# Patient Record
Sex: Male | Born: 1970 | Race: White | Hispanic: No | Marital: Married | Smoking: Never smoker
Health system: Southern US, Community
[De-identification: ages and names within clinical notes are randomized; demographics above are authoritative.]

## PROBLEM LIST (undated history)

## (undated) DIAGNOSIS — F419 Anxiety disorder, unspecified: Secondary | ICD-10-CM

## (undated) DIAGNOSIS — T7840XA Allergy, unspecified, initial encounter: Secondary | ICD-10-CM

## (undated) DIAGNOSIS — K219 Gastro-esophageal reflux disease without esophagitis: Secondary | ICD-10-CM

## (undated) HISTORY — DX: Anxiety disorder, unspecified: F41.9

## (undated) HISTORY — DX: Gastro-esophageal reflux disease without esophagitis: K21.9

## (undated) HISTORY — DX: Allergy, unspecified, initial encounter: T78.40XA

## (undated) HISTORY — PX: APPENDECTOMY: SHX54

---

## 2004-07-23 ENCOUNTER — Emergency Department: Payer: Self-pay | Admitting: Emergency Medicine

## 2007-03-23 ENCOUNTER — Emergency Department: Payer: Self-pay | Admitting: Unknown Physician Specialty

## 2013-06-07 ENCOUNTER — Observation Stay: Payer: Self-pay | Admitting: Surgery

## 2013-06-07 LAB — COMPREHENSIVE METABOLIC PANEL
Albumin: 4 g/dL (ref 3.4–5.0)
Alkaline Phosphatase: 65 U/L
Anion Gap: 3 — ABNORMAL LOW (ref 7–16)
BUN: 13 mg/dL (ref 7–18)
Bilirubin,Total: 0.6 mg/dL (ref 0.2–1.0)
Calcium, Total: 9.4 mg/dL (ref 8.5–10.1)
Chloride: 107 mmol/L (ref 98–107)
Co2: 26 mmol/L (ref 21–32)
Creatinine: 0.97 mg/dL (ref 0.60–1.30)
EGFR (African American): >60
EGFR (Non-African Amer.): >60
Glucose: 98 mg/dL (ref 65–99)
Osmolality: 272 (ref 275–301)
Potassium: 4 mmol/L (ref 3.5–5.1)
SGOT(AST): 41 U/L — ABNORMAL HIGH (ref 15–37)
SGPT (ALT): 84 U/L — ABNORMAL HIGH (ref 12–78)
Sodium: 136 mmol/L (ref 136–145)
Total Protein: 8.1 g/dL (ref 6.4–8.2)

## 2013-06-07 LAB — CBC WITH DIFFERENTIAL/PLATELET
Basophil #: 0.1 10*3/uL (ref 0.0–0.1)
Basophil %: 0.5 %
Eosinophil #: 0.2 10*3/uL (ref 0.0–0.7)
Eosinophil %: 1.1 %
HCT: 49.8 % (ref 40.0–52.0)
HGB: 16.9 g/dL (ref 13.0–18.0)
Lymphocyte #: 4 10*3/uL — ABNORMAL HIGH (ref 1.0–3.6)
Lymphocyte %: 26.5 %
MCH: 30.9 pg (ref 26.0–34.0)
MCHC: 33.9 g/dL (ref 32.0–36.0)
MCV: 91 fL (ref 80–100)
Monocyte #: 1.2 x10 3/mm — ABNORMAL HIGH (ref 0.2–1.0)
Monocyte %: 7.8 %
Neutrophil #: 9.8 10*3/uL — ABNORMAL HIGH (ref 1.4–6.5)
Neutrophil %: 64.1 %
Platelet: 213 10*3/uL (ref 150–440)
RBC: 5.46 10*6/uL (ref 4.40–5.90)
RDW: 12.4 % (ref 11.5–14.5)
WBC: 15.3 10*3/uL — ABNORMAL HIGH (ref 3.8–10.6)

## 2013-06-07 LAB — URINALYSIS, COMPLETE
Bacteria: NONE SEEN
Bilirubin,UR: NEGATIVE
Blood: NEGATIVE
Glucose,UR: NEGATIVE mg/dL (ref 0–75)
Ketone: NEGATIVE
Leukocyte Esterase: NEGATIVE
Nitrite: NEGATIVE
Ph: 5 (ref 4.5–8.0)
Protein: NEGATIVE
RBC,UR: <1 /HPF (ref 0–5)
Specific Gravity: 1.014 (ref 1.003–1.030)
Squamous Epithelial: <1
WBC UR: <1 /HPF (ref 0–5)

## 2013-06-07 LAB — LIPASE, BLOOD: Lipase: 142 U/L (ref 73–393)

## 2013-06-09 LAB — PATHOLOGY REPORT

## 2014-10-29 NOTE — Op Note (Signed)
PATIENT NAME:  Francisco Oneal, Francisco Oneal MR#:  [id number] DATE OF BIRTH:  [birth year]  DATE OF PROCEDURE:  06/07/2013  PREOPERATIVE DIAGNOSIS: Acute appendicitis.   POSTOPERATIVE DIAGNOSIS: Acute/chronic appendicitis.   PROCEDURE PERFORMED:  1.  Laparoscopic appendectomy.  2.  Evaluation of the distal small bowel, approximately 2 feet.   ESTIMATED BLOOD LOSS: 15 mL.   COMPLICATIONS: None.   SPECIMENS: Appendix.   INDICATION FOR SURGERY: The patient is a pleasant, 44 year old who presented with right lower quadrant pain, enlarged and leukocytosis. His clinical story appeared to be consistent with appendicitis. He was thus brought to the Operating Room suite for laparoscopic appendectomy.   DETAILS OF PROCEDURE: Informed consent was obtained. The patient was brought to the Operating Room Suite. He was laid supine on the Operating Room table. He was induced. Endotracheal tube was placed, general anesthesia was administered. His abdomen was then prepped and draped in standard surgical fashion. A timeout was then performed, correctly identifying the patient name, operative site and procedure to be performed. A supraumbilical incision was made. This was deepened down to the fascia. The fascia was incised. The peritoneum was entered. Two stay sutures were placed through the fasciotomy. The Hasson trocar was placed in the abdomen. The abdomen was insufflated. A 5 mm left lower quadrant and suprapubic trocar were placed. The appendix was then visualized. It was adherent to the abdominal sidewall. It was mobilized bluntly from peritoneal attachments. It appeared to be mildly inflamed. I then used a laparoscopic [place] dissector to place a hole in the mesoappendix at the base of the cecum. An endoscopic stapler was then placed across the base of the appendix and ligated. A second load was then placed across the mesoappendix. The appendix was then taken out with an Endo Catch bag through the supraumbilical trocar site.  The appendiceal and mesoappendiceal staple lines were examined and noted to be hemostatic. The abdomen was then irrigated with normal saline. The terminal ileum and distal approximately 2 feet of bowel were then examined.  It was noted to be adherent to the abdominal sidewall in the terminal ileum and then free elsewhere. No obvious Meckel's diverticulum was encountered, inflamed or not. The staple lines were then examined again and noted to be hemostatic. All trocars were then removed, and pneumoperitoneum was evacuated. The supraumbilical trocars were then closed, using the previously placed stay sutures of 0 Vicryl figure-of-eight. The skin was then closed using interrupted 4-0 Monocryl sutures. Steri-Strips, Telfa gauze and Tegaderm were then used to complete the dressing. The patient was then awoken, extubated and brought to the postanesthesia care unit. There were no immediate complications. Needle, sponge, and instrument counts were correct at the end of the procedure.    ____________________________ Glena Norfolk. Ashely Goosby, MD cal:cg D: 06/07/2013 08:67:61 ET T: 06/07/2013 23:51:32 ET JOB#: [id number]  cc: Harrell Gave A. Alaylah Heatherington, MD, <Dictator> Floyde Parkins MD ELECTRONICALLY SIGNED 06/18/2013 11:37

## 2014-10-29 NOTE — H&P (Signed)
History of Present Illness 49 yom who has had RLQ abdominal pain off an on for a year. Last night (~ [place]) he experienced difuse lower abdominal pain, which woke him up twice and migrated to his RLQ. Initially it was quite severe (moreso than ever before), but now it has subsided significantly. No nausea initially, but anorexic now. No vomiting, and no fever. In the past, he has not been tender when examined by his PCP.   Past Med/Surgical Hx:  allergies:   ALLERGIES:  Bee Stings: Hives  Penicillin: Unknown  Family and Social History:  Family History Non-Contributory   Social History negative tobacco, negative ETOH, married, 1 child, works in [employer] (of [employer], etc.)   Place of Living Home   Review of Systems:  Fever/Chills No   Cough No   Sputum No   Abdominal Pain Yes   Diarrhea No   Constipation No   Nausea/Vomiting Yes   SOB/DOE No   Chest Pain No   Dysuria No   Tolerating PT Yes   Tolerating Diet Yes  last meal was last night   Medications/Allergies Reviewed Medications/Allergies reviewed   Physical Exam:  GEN well developed, well nourished, obese   HEENT pink conjunctivae, PERRL, hearing intact to voice, moist oral mucosa   NECK supple  No masses  thyroid not tender  trachea midline   RESP normal resp effort  clear BS  no use of accessory muscles   CARD regular rate  no murmur  no JVD  no Rub   ABD positive tenderness  obese, soft everywhere except RLQ, which displays moderate tenderness, but no rebound or guarding   LYMPH negative neck   EXTR negative cyanosis/clubbing, negative edema   SKIN normal to palpation, No rashes, No ulcers, skin turgor good   NEURO cranial nerves intact, negative tremor, follows commands, motor/sensory function intact   PSYCH alert, A+O to time, place, person, good insight   Lab Results: Hepatic:  30-Nov-14 11:17   Bilirubin, Total 0.6  Alkaline Phosphatase 65 (45-117 NOTE: New Reference  Range 05/29/13)  SGPT (ALT)  84  SGOT (AST)  41  Total Protein, Serum 8.1  Albumin, Serum 4.0  Routine Chem:  30-Nov-14 11:17   Glucose, Serum 98  BUN 13  Creatinine (comp) 0.97  Sodium, Serum 136  Potassium, Serum 4.0  Chloride, Serum 107  CO2, Serum 26  Calcium (Total), Serum 9.4  Osmolality (calc) 272  eGFR (African American) >60  eGFR (Non-African American) >60 (eGFR values <34m/min/1.73 m2 may be an indication of chronic kidney disease (CKD). Calculated eGFR is useful in patients with stable renal function. The eGFR calculation will not be reliable in acutely ill patients when serum creatinine is changing rapidly. It is not useful in  patients on dialysis. The eGFR calculation may not be applicable to patients at the low and high extremes of body sizes, pregnant women, and vegetarians.)  Anion Gap  3  Lipase 142 (Result(s) reported on 07 Jun 2013 at 11:58AM.)  Routine UA:  30-Nov-14 11:17   Color (UA) Yellow  Clarity (UA) Clear  Glucose (UA) Negative  Bilirubin (UA) Negative  Ketones (UA) Negative  Specific Gravity (UA) 1.014  Blood (UA) Negative  pH (UA) 5.0  Protein (UA) Negative  Nitrite (UA) Negative  Leukocyte Esterase (UA) Negative (Result(s) reported on 07 Jun 2013 at 11:46AM.)  RBC (UA) <1 /HPF  WBC (UA) <1 /HPF  Bacteria (UA) NONE SEEN  Epithelial Cells (UA) <1 /HPF  Mucous (  UA) PRESENT (Result(s) reported on 07 Jun 2013 at 11:46AM.)  Routine Hem:  30-Nov-14 11:17   WBC (CBC)  15.3  RBC (CBC) 5.46  Hemoglobin (CBC) 16.9  Hematocrit (CBC) 49.8  Platelet Count (CBC) 213  MCV 91  MCH 30.9  MCHC 33.9  RDW 12.4  Neutrophil % 64.1  Lymphocyte % 26.5  Monocyte % 7.8  Eosinophil % 1.1  Basophil % 0.5  Neutrophil #  9.8  Lymphocyte #  4.0  Monocyte #  1.2  Eosinophil # 0.2  Basophil # 0.1 (Result(s) reported on 07 Jun 2013 at 11:38AM.)   Radiology Results: LabUnknown:    30-Nov-14 12:57, CT Abdomen and Pelvis With Contrast  PACS Image  CT:   CT Abdomen and Pelvis With Contrast  REASON FOR EXAM:    (1) rlq pain; (2) same  COMMENTS:   LMP: (Male)    PROCEDURE: CT  - CT ABDOMEN / PELVIS  W  - Jun 07 2013 12:57PM     CLINICAL DATA:  Right-sided abdominal pain for 1 year, progressive  within the past week    EXAM:  CT ABDOMEN AND PELVIS WITH CONTRAST    TECHNIQUE:  Multidetector CT imaging of the abdomen and pelvis was performed  using the standard protocol following bolus administration of  intravenous contrast.  CONTRAST:  125 cc Isovue 370    COMPARISON:  None.    FINDINGS:  The base of the appendix appears enlarged measuring approximately 1  cm in greatest oblique axial and coronal dimensions (axial image 58,  series 2; coronal image 73, series 6). This finding is associated  with a very minimal amount of adjacent periappendiceal stranding  (axial image 59, series 2). Additionally, there are several  scattered shotty mesenteric lymph nodes within the right lower  abdominal quadrant with index right lower quadrant mesenteric node  measuring approximately 0.8 cm ingreatest short axis diameter  (image 51, series 2). No evidence of perforation.  Ingested enteric contrast extends to the level of the distal small  bowel. Scattered minimal colonic diverticulosis without evidence of  diverticulitis. The bowel is otherwise normal in course and caliber  without discrete area of wall thickening or hyperenhancement. No  pneumoperitoneum, pneumatosis or portal venous gas.    Normal hepatic contour. There is mild diffuse decreased attenuation  of the hepatic parenchyma on this postcontrast examination  suggestive hepatic steatosis. No discrete hepatic lesions. The  gallbladder is decompressed but otherwise normal. No definite intra  or extrahepatic position dilatation. No ascites.    There is symmetric enhancementand excretion of the bilateral  kidneys. No definite renal stones on this postcontrast examination.  No  discrete renal lesions. No urinary obstruction or perinephric  stranding. Normal appearance of the bilateral adrenal glands,  pancreas and spleen.Incidental note is made of a small splenule.    Normal appearance of the pelvic organs. No free fluid within the  pelvis.    Limited visualization the lower thorax is negative for focal  airspace opacity or pleural effusion.    Normal heart size.  Nopericardial effusion.    No acute or aggressive osseus abnormalities. Incidental note is a  made of a limbus body involving the anterior aspect of the superior  endplate of the X41 vertebral body.   IMPRESSION:  1. Findings worrisome for early, acuteappendicitis with wall  thickening involving the base of the appendix and associated very  minimal amount of adjacent periappendiceal stranding and reactive  right lower quadrant mesenteric lymph nodes. No evidence  of  perforation or drainable fluid collection.  2. Colonic diverticulosis without evidence of diverticulitis.      Electronically Signed    By: Sandi Mariscal M.D.    On: 06/07/2013 13:09         Verified By: Aileen Fass, M.D.,    Assessment/Admission Diagnosis Early acute appendicitis, likely recurrent   Plan Lap appy   Electronic Signatures: Consuela Mimes (MD)  (Signed [phone] 15:36)  Authored: CHIEF COMPLAINT and HISTORY, PAST MEDICAL/SURGIAL HISTORY, ALLERGIES, FAMILY AND SOCIAL HISTORY, REVIEW OF SYSTEMS, PHYSICAL EXAM, LABS, Radiology, ASSESSMENT AND PLAN   Last Updated: 30-Nov-14 15:36 by Consuela Mimes (MD)

## 2014-12-08 ENCOUNTER — Inpatient Hospital Stay
Admission: RE | Admit: 2014-12-08 | Discharge: 2014-12-09 | DRG: 502 | Disposition: A | Discharge status: Home / Self Care | Payer: No Typology Code available for payment source | Source: Ambulatory Visit | Attending: Surgery | Admitting: Surgery

## 2014-12-08 ENCOUNTER — Ambulatory Visit: Payer: No Typology Code available for payment source | Admitting: Anesthesiology

## 2014-12-08 ENCOUNTER — Encounter
Admission: RE | Disposition: A | Discharge status: Home / Self Care | Payer: Self-pay | Source: Ambulatory Visit | Attending: Surgery

## 2014-12-08 ENCOUNTER — Encounter: Payer: Self-pay | Admitting: *Deleted

## 2014-12-08 DIAGNOSIS — S52572A Other intraarticular fracture of lower end of left radius, initial encounter for closed fracture: Secondary | ICD-10-CM | POA: Diagnosis present

## 2014-12-08 DIAGNOSIS — S51012A Laceration without foreign body of left elbow, initial encounter: Secondary | ICD-10-CM | POA: Diagnosis present

## 2014-12-08 DIAGNOSIS — S51019A Laceration without foreign body of unspecified elbow, initial encounter: Secondary | ICD-10-CM | POA: Diagnosis present

## 2014-12-08 HISTORY — PX: INCISION AND DRAINAGE OF WOUND: SHX1803

## 2014-12-08 HISTORY — PX: OPEN REDUCTION INTERNAL FIXATION (ORIF) DISTAL RADIAL FRACTURE: SHX5989

## 2014-12-08 SURGERY — OPEN REDUCTION INTERNAL FIXATION (ORIF) DISTAL RADIUS FRACTURE
Anesthesia: General | Laterality: Left | Wound class: Clean

## 2014-12-08 MED ORDER — FENTANYL CITRATE (PF) 100 MCG/2ML IJ SOLN
25.0000 ug | INTRAMUSCULAR | Status: DC | PRN
  Administered 2014-12-08: 25 ug via INTRAVENOUS

## 2014-12-08 MED ORDER — DOCUSATE SODIUM 100 MG PO CAPS
100.0000 mg | ORAL_CAPSULE | Freq: Two times a day (BID) | ORAL | Status: DC
  Administered 2014-12-08 – 2014-12-09 (×2): 100 mg via ORAL
  Filled 2014-12-08 (×2): qty 1

## 2014-12-08 MED ORDER — GLYCOPYRROLATE 0.2 MG/ML IJ SOLN
INTRAMUSCULAR | Status: DC | PRN
  Administered 2014-12-08: 0.2 mg via INTRAVENOUS

## 2014-12-08 MED ORDER — DEXTROSE 5 % IV SOLN
3.0000 g | Freq: Once | INTRAVENOUS | Status: AC
  Administered 2014-12-08: 3 g via INTRAVENOUS
  Filled 2014-12-08: qty 3000

## 2014-12-08 MED ORDER — FENTANYL CITRATE (PF) 100 MCG/2ML IJ SOLN
INTRAMUSCULAR | Status: DC | PRN
  Administered 2014-12-08 (×5): 50 ug via INTRAVENOUS

## 2014-12-08 MED ORDER — ONDANSETRON HCL 4 MG/2ML IJ SOLN: 4.0000 mg | Freq: Four times a day (QID) | INTRAMUSCULAR | Status: DC | PRN

## 2014-12-08 MED ORDER — BUPIVACAINE HCL (PF) 0.5 % IJ SOLN
INTRAMUSCULAR | Status: AC
  Filled 2014-12-08: qty 30

## 2014-12-08 MED ORDER — FENTANYL CITRATE (PF) 100 MCG/2ML IJ SOLN
INTRAMUSCULAR | Status: AC
Start: 2014-12-08 — End: 2014-12-08
  Administered 2014-12-08: 25 ug via INTRAVENOUS
  Filled 2014-12-08: qty 2

## 2014-12-08 MED ORDER — DEXTROSE 5 % IV SOLN
3.0000 g | Freq: Three times a day (TID) | INTRAVENOUS | Status: DC
  Administered 2014-12-08 – 2014-12-09 (×3): 3 g via INTRAVENOUS
  Filled 2014-12-08 (×5): qty 3000

## 2014-12-08 MED ORDER — HYDROMORPHONE HCL 1 MG/ML IJ SOLN: 0.5000 mg | INTRAMUSCULAR | Status: DC | PRN

## 2014-12-08 MED ORDER — PROPOFOL 10 MG/ML IV BOLUS
INTRAVENOUS | Status: DC | PRN
  Administered 2014-12-08: 200 mg via INTRAVENOUS

## 2014-12-08 MED ORDER — ONDANSETRON HCL 4 MG/2ML IJ SOLN
INTRAMUSCULAR | Status: DC | PRN
Start: 2014-12-08 — End: 2014-12-08
  Administered 2014-12-08: 4 mg via INTRAVENOUS

## 2014-12-08 MED ORDER — ACETAMINOPHEN 10 MG/ML IV SOLN
INTRAVENOUS | Status: DC | PRN
  Administered 2014-12-08: 1000 mg via INTRAVENOUS

## 2014-12-08 MED ORDER — ONDANSETRON HCL 4 MG PO TABS: 4.0000 mg | ORAL_TABLET | Freq: Four times a day (QID) | ORAL | Status: DC | PRN

## 2014-12-08 MED ORDER — KETOROLAC TROMETHAMINE 30 MG/ML IJ SOLN
INTRAMUSCULAR | Status: DC | PRN
  Administered 2014-12-08: 30 mg via INTRAVENOUS

## 2014-12-08 MED ORDER — KETOROLAC TROMETHAMINE 15 MG/ML IJ SOLN
15.0000 mg | Freq: Four times a day (QID) | INTRAMUSCULAR | Status: AC
  Administered 2014-12-08 – 2014-12-09 (×3): 15 mg via INTRAVENOUS
  Filled 2014-12-08 (×3): qty 1

## 2014-12-08 MED ORDER — LACTATED RINGERS IV SOLN
INTRAVENOUS | Status: DC
  Administered 2014-12-08 (×2): via INTRAVENOUS

## 2014-12-08 MED ORDER — NEOMYCIN-POLYMYXIN B GU 40-200000 IR SOLN
Status: AC
  Filled 2014-12-08: qty 4

## 2014-12-08 MED ORDER — ENOXAPARIN SODIUM 40 MG/0.4ML ~~LOC~~ SOLN
40.0000 mg | SUBCUTANEOUS | Status: DC
  Administered 2014-12-09: 40 mg via SUBCUTANEOUS
  Filled 2014-12-08: qty 0.4

## 2014-12-08 MED ORDER — HYDROMORPHONE HCL 1 MG/ML IJ SOLN
INTRAMUSCULAR | Status: AC
  Filled 2014-12-08: qty 1

## 2014-12-08 MED ORDER — HYDROMORPHONE HCL 1 MG/ML IJ SOLN
0.2500 mg | INTRAMUSCULAR | Status: DC | PRN
  Administered 2014-12-08 (×4): 0.25 mg via INTRAVENOUS

## 2014-12-08 MED ORDER — METOCLOPRAMIDE HCL 10 MG PO TABS: 5.0000 mg | ORAL_TABLET | Freq: Three times a day (TID) | ORAL | Status: DC | PRN

## 2014-12-08 MED ORDER — FENTANYL CITRATE (PF) 100 MCG/2ML IJ SOLN
25.0000 ug | INTRAMUSCULAR | Status: DC | PRN
  Administered 2014-12-08 (×3): 25 ug via INTRAVENOUS
  Filled 2014-12-08 (×4): qty 0.5

## 2014-12-08 MED ORDER — DIPHENHYDRAMINE HCL 12.5 MG/5ML PO ELIX
12.5000 mg | ORAL_SOLUTION | ORAL | Status: DC | PRN
  Administered 2014-12-08 (×2): 12.5 mg via ORAL
  Filled 2014-12-08: qty 10

## 2014-12-08 MED ORDER — MAGNESIUM HYDROXIDE 400 MG/5ML PO SUSP: 30.0000 mL | Freq: Every day | ORAL | Status: DC | PRN

## 2014-12-08 MED ORDER — KCL IN DEXTROSE-NACL 20-5-0.9 MEQ/L-%-% IV SOLN
INTRAVENOUS | Status: DC
  Administered 2014-12-08: 22:00:00 via INTRAVENOUS
  Filled 2014-12-08 (×5): qty 1000

## 2014-12-08 MED ORDER — ONDANSETRON HCL 4 MG/2ML IJ SOLN: 4.0000 mg | Freq: Once | INTRAMUSCULAR | Status: DC | PRN

## 2014-12-08 MED ORDER — FAMOTIDINE 20 MG PO TABS
ORAL_TABLET | ORAL | Status: AC
  Administered 2014-12-08: 20 mg via ORAL
  Filled 2014-12-08: qty 1

## 2014-12-08 MED ORDER — LIDOCAINE HCL (CARDIAC) 20 MG/ML IV SOLN
INTRAVENOUS | Status: DC | PRN
  Administered 2014-12-08: 100 mg via INTRAVENOUS

## 2014-12-08 MED ORDER — FAMOTIDINE 20 MG PO TABS
20.0000 mg | ORAL_TABLET | Freq: Once | ORAL | Status: AC
  Administered 2014-12-08: 20 mg via ORAL

## 2014-12-08 MED ORDER — FENTANYL CITRATE (PF) 100 MCG/2ML IJ SOLN
INTRAMUSCULAR | Status: AC
  Filled 2014-12-08: qty 2

## 2014-12-08 MED ORDER — ACETAMINOPHEN 325 MG PO TABS: 650.0000 mg | ORAL_TABLET | Freq: Four times a day (QID) | ORAL | Status: DC | PRN

## 2014-12-08 MED ORDER — BISACODYL 10 MG RE SUPP: 10.0000 mg | Freq: Every day | RECTAL | Status: DC | PRN

## 2014-12-08 MED ORDER — ACETAMINOPHEN 650 MG RE SUPP: 650.0000 mg | Freq: Four times a day (QID) | RECTAL | Status: DC | PRN

## 2014-12-08 MED ORDER — OXYCODONE HCL 5 MG PO TABS
5.0000 mg | ORAL_TABLET | ORAL | Status: DC | PRN
  Administered 2014-12-08 (×3): 5 mg via ORAL
  Administered 2014-12-09 (×4): 10 mg via ORAL
  Filled 2014-12-08 (×2): qty 1
  Filled 2014-12-08 (×4): qty 2
  Filled 2014-12-08: qty 1

## 2014-12-08 MED ORDER — METOCLOPRAMIDE HCL 5 MG/ML IJ SOLN: 5.0000 mg | Freq: Three times a day (TID) | INTRAMUSCULAR | Status: DC | PRN

## 2014-12-08 MED ORDER — SODIUM CHLORIDE 0.9 % IR SOLN
Status: DC | PRN
  Administered 2014-12-08: 3000 mL

## 2014-12-08 MED ORDER — MIDAZOLAM HCL 2 MG/2ML IJ SOLN
INTRAMUSCULAR | Status: DC | PRN
  Administered 2014-12-08: 2 mg via INTRAVENOUS

## 2014-12-08 MED ORDER — FAMOTIDINE 20 MG PO TABS
ORAL_TABLET | ORAL | Status: DC
Start: 2014-12-08 — End: 2014-12-08
  Filled 2014-12-08: qty 1

## 2014-12-08 MED ORDER — ACETAMINOPHEN 10 MG/ML IV SOLN
INTRAVENOUS | Status: AC
  Filled 2014-12-08: qty 100

## 2014-12-08 MED ORDER — FLEET ENEMA 7-19 GM/118ML RE ENEM: 1.0000 | ENEMA | Freq: Once | RECTAL | Status: AC | PRN

## 2014-12-08 SURGICAL SUPPLY — 27 items
BANDAGE ELASTIC 4 CLIP ST LF (GAUZE/BANDAGES/DRESSINGS) ×2 IMPLANT
BLADE SURG 15 STRL LF DISP TIS (BLADE) ×1 IMPLANT
BLADE SURG 15 STRL SS (BLADE) ×1
BNDG COHESIVE 4X5 TAN STRL (GAUZE/BANDAGES/DRESSINGS) ×2 IMPLANT
CHLORAPREP W/TINT 26ML (MISCELLANEOUS) IMPLANT
DRAPE FLUOR MINI C-ARM 54X84 (DRAPES) ×2 IMPLANT
ELECT CAUTERY BLADE 6.4 (BLADE) ×2 IMPLANT
GAUZE PETRO XEROFOAM 1X8 (MISCELLANEOUS) ×2 IMPLANT
GAUZE SPONGE 4X4 12PLY STRL (GAUZE/BANDAGES/DRESSINGS) ×2 IMPLANT
GLOVE BIO SURGEON STRL SZ8 (GLOVE) ×2 IMPLANT
GLOVE BIO SURGEON STRL SZ8.5 (GLOVE) ×2 IMPLANT
GLOVE INDICATOR 8.0 STRL GRN (GLOVE) ×2 IMPLANT
GLOVE SURG ORTHO 8.0 STRL STRW (GLOVE) ×2 IMPLANT
GOWN STRL REUS W/ TWL LRG LVL3 (GOWN DISPOSABLE) ×1 IMPLANT
GOWN STRL REUS W/ TWL XL LVL3 (GOWN DISPOSABLE) ×2 IMPLANT
GOWN STRL REUS W/TWL LRG LVL3 (GOWN DISPOSABLE) ×1
GOWN STRL REUS W/TWL XL LVL3 (GOWN DISPOSABLE) ×2
HEMOVAC 400CC 10FR (MISCELLANEOUS) ×2 IMPLANT
PACK BASIC III (MISCELLANEOUS) ×1
PACK SRG BSC III STRL LF (MISCELLANEOUS) ×1 IMPLANT
PAD CAST CTTN 4X4 STRL (SOFTGOODS) ×1 IMPLANT
PADDING CAST COTTON 4X4 STRL (SOFTGOODS) ×1
SCRUB POVIDONE IODINE 4 OZ (MISCELLANEOUS) ×2 IMPLANT
STOCKINETTE IMPERVIOUS 9X36 MD (GAUZE/BANDAGES/DRESSINGS) ×2 IMPLANT
STRAP SAFETY BODY (MISCELLANEOUS) ×2 IMPLANT
SUT PROLENE 0 CT 1 30 (SUTURE) ×2 IMPLANT
SUT VIC AB 2-0 CT2 27 (SUTURE) ×2 IMPLANT

## 2014-12-08 NOTE — Progress Notes (Signed)
S/P Left radial ORIF and I&D of left elbow wound. Pt's alert and oriented x 3,Left arm- dressing dry/clean/intact/with JP drain/elevated on pillows. scd and ted's on ble. IV site intact right ac. No complain of pain. V/S assess. Oriented to room and call light sys. On Fall Precautions.

## 2014-12-08 NOTE — Anesthesia Preprocedure Evaluation (Signed)
Anesthesia Evaluation  Patient identified by MRN, date of birth, ID band Patient awake    Reviewed: Allergy & Precautions, NPO status , Patient's Chart, lab work & pertinent test results  History of Anesthesia Complications Negative for: history of anesthetic complications  Airway Mallampati: III  TM Distance: >3 FB Neck ROM: Full    Dental no notable dental hx.    Pulmonary neg pulmonary ROS,  breath sounds clear to auscultation  Pulmonary exam normal       Cardiovascular Exercise Tolerance: Good negative cardio ROS Normal cardiovascular examRhythm:Regular Rate:Normal     Neuro/Psych negative neurological ROS  negative psych ROS   GI/Hepatic negative GI ROS, Neg liver ROS,   Endo/Other  negative endocrine ROS  Renal/GU negative Renal ROS  negative genitourinary   Musculoskeletal negative musculoskeletal ROS (+)   Abdominal   Peds negative pediatric ROS (+)  Hematology negative hematology ROS (+)   Anesthesia Other Findings   Reproductive/Obstetrics negative OB ROS                             Anesthesia Physical Anesthesia Plan  ASA: II  Anesthesia Plan: General   Post-op Pain Management:    Induction: Intravenous  Airway Management Planned: LMA  Additional Equipment:   Intra-op Plan:   Post-operative Plan: Extubation in OR  Informed Consent:   Dental advisory given  Plan Discussed with: CRNA and Surgeon  Anesthesia Plan Comments:         Anesthesia Quick Evaluation

## 2014-12-08 NOTE — Anesthesia Procedure Notes (Signed)
Procedure Name: LMA Insertion Date/Time: 12/08/2014 3:24 PM Performed by: Doreen Salvage Pre-anesthesia Checklist: Patient identified, Patient being monitored, Timeout performed, Emergency Drugs available and Suction available Patient Re-evaluated:Patient Re-evaluated prior to inductionOxygen Delivery Method: Circle system utilized Preoxygenation: Pre-oxygenation with 100% oxygen Intubation Type: IV induction Ventilation: Mask ventilation without difficulty LMA: LMA inserted LMA Size: 4.5 Tube type: Oral Number of attempts: 1 Placement Confirmation: positive ETCO2 and breath sounds checked- equal and bilateral Tube secured with: Tape Dental Injury: Teeth and Oropharynx as per pre-operative assessment
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
## 2014-12-08 NOTE — Transfer of Care (Signed)
  Immediate Anesthesia Transfer of Care Note  Patient: Francisco Oneal  Procedure(s) Performed: Procedure(s): OPEN REDUCTION INTERNAL FIXATION (ORIF) DISTAL RADIAL FRACTURE (Left) IRRIGATION AND DEBRIDEMENT WOUND (Left)  Patient Location: PACU  Anesthesia Type:General  Level of Consciousness: sedated  Airway & Oxygen Therapy: Patient Spontanous Breathing and Patient connected to face mask oxygen  Post-op Assessment: Report given to RN and Post -op Vital signs reviewed and stable  Post vital signs: Reviewed and stable  Last Vitals:  Filed Vitals:   12/08/14 1650  BP: 120/66  Pulse: 95  Temp: 36.7 C  Resp: 17    Complications: No apparent anesthesia complications

## 2014-12-08 NOTE — Anesthesia Postprocedure Evaluation (Signed)
  Anesthesia Post-op Note  Patient: Francisco Oneal  Procedure(s) Performed: Procedure(s): OPEN REDUCTION INTERNAL FIXATION (ORIF) DISTAL RADIAL FRACTURE (Left) IRRIGATION AND DEBRIDEMENT WOUND (Left)  Anesthesia type:General  Patient location: PACU  Post pain: Pain level controlled  Post assessment: Post-op Vital signs reviewed, Patient's Cardiovascular Status Stable, Respiratory Function Stable, Patent Airway and No signs of Nausea or vomiting  Post vital signs: Reviewed and stable  Last Vitals:  Filed Vitals:   12/08/14 1651  BP: 120/66  Pulse: 107  Temp: 36.7 C  Resp:     Level of consciousness: awake, alert  and patient cooperative  Complications: No apparent anesthesia complications

## 2014-12-08 NOTE — H&P (Signed)
Paper H&P to be scanned into permanent record. H&P reviewed. No changes. 

## 2014-12-09 ENCOUNTER — Encounter: Payer: Self-pay | Admitting: Surgery

## 2014-12-09 MED ORDER — OXYCODONE HCL 5 MG PO TABS: 5.0000 mg | ORAL_TABLET | ORAL | Status: DC | PRN

## 2014-12-09 MED ORDER — SULFAMETHOXAZOLE-TRIMETHOPRIM 800-160 MG PO TABS: 1.0000 | ORAL_TABLET | Freq: Two times a day (BID) | ORAL | Status: AC

## 2014-12-09 NOTE — Progress Notes (Signed)
Ramona receiving IV ABX with no adverse effects, afebrile. Pain managed with PRN medications. Voiding well with urinal. Tolerating regular foods well. Slept well through the night. Plan is to D/C to home after IV ABX. Nursing continues to monitor and assist with ADLs.

## 2014-12-09 NOTE — Progress Notes (Signed)
Order to discharge patient to home today, spouse to take patient home, discharge instructions given per MD order, Rx.slip given for oxycodone and bactrim, patient to call for post-op follow up appt tomorrow. IV site discontinue, cath tip intact,drsg applied. Discharge via wheelchair with Laureen Abrahams.

## 2014-12-09 NOTE — Progress Notes (Signed)
  Subjective: 1 Day Post-Op Procedure(s) (LRB): OPEN REDUCTION INTERNAL FIXATION (ORIF) DISTAL RADIAL FRACTURE (Left) IRRIGATION AND DEBRIDEMENT WOUND (Left) Patient reports pain as mild.   Patient seen in rounds with Dr. Roland Rack. Patient is well, and has had no acute complaints or problems Plan is to go Home after hospital stay. Negative for chest pain and shortness of breath Fever: no Gastrointestinal:negative for nausea and vomiting  Objective: Vital signs in last 24 hours: Temp:  [97.5 F (36.4 C)-99.4 F (37.4 C)] 98.2 F (36.8 C) (06/02 0359) Pulse Rate:  [73-108] 86 (06/02 0359) Resp:  [14-18] 18 (06/02 0359) BP: (105-139)/(53-87) 116/59 mmHg (06/02 0359) SpO2:  [94 %-99 %] 98 % (06/02 0359) Weight:  [133.811 kg (295 lb)] 133.811 kg (295 lb) (06/01 1246)  Intake/Output from previous day:  Intake/Output Summary (Last 24 hours) at 12/09/14 0609 Last data filed at 12/09/14 0402  Gross per 24 hour  Intake   1540 ml  Output   1570 ml  Net    -30 ml    Intake/Output this shift: Total I/O In: 440 [I.V.:390; IV Piggyback:50] Out: 0623 [Urine:1545]  Labs: No results for input(s): HGB in the last 72 hours. No results for input(s): WBC, RBC, HCT, PLT in the last 72 hours. No results for input(s): NA, K, CL, CO2, BUN, CREATININE, GLUCOSE, CALCIUM in the last 72 hours. No results for input(s): LABPT, INR in the last 72 hours.   EXAM General - Patient is Alert and Oriented Extremity - Sensation intact distally Dorsiflexion/Plantar flexion intact in fingers Dressing/Incision - clean, the Hemovac was intact with minimal drainage Motor Function - intact, moving fingers and shoulder well on exam.    History reviewed. No pertinent past medical history.  Assessment/Plan: 1 Day Post-Op Procedure(s) (LRB): OPEN REDUCTION INTERNAL FIXATION (ORIF) DISTAL RADIAL FRACTURE (Left) IRRIGATION AND DEBRIDEMENT WOUND (Left) Active Problems:   Laceration of elbow with  complication  Estimated body mass index is 40 kg/(m^2) as calculated from the following:   Height as of this encounter: 6' (1.829 m).   Weight as of this encounter: 133.811 kg (295 lb). Advance diet Up with therapy Discharge home with home health and IV antibiotics are finished and the drain is removed. Possible today or tomorrow  DVT Prophylaxis - Lovenox Weight-Bearing as tolerated   Reche Dixon, PA-C Orthopaedic Surgery 12/09/2014, 6:09 AM

## 2014-12-09 NOTE — Care Management (Signed)
Patient sitting independently in bedside chair. No RNCM needs identified. Patient states he lives with his wife Lenna Sciara who works as a Warden/ranger. He would like to return home today. His PCP is Dr. Rosanna Randy. He uses [facility] for Rx [phone]. He has transportation to appointments.

## 2014-12-09 NOTE — Progress Notes (Signed)
PT Screen/Cancellation Note  Patient Details Name: Francisco Oneal MRN: [id number] DOB: May 31, 1971   Cancelled/Discontinued Treatment:     PT order received for patient. Chart reviewed and RN consulted. Pt is an otherwise active 44 yo male. Per patient and RN, pt is completely independent with mobility and ambulation in room. Quick screen performed with patient who is independent with bed mobility, transfers, and ambulation without assistive device. No gross balance deficits noted. Pt has no PT needs at this time. Patient agrees. Order will be completed. Please enter new order if status changes.  Lyndel Safe Huprich PT, DPT   Huprich,Jason 12/09/2014, 8:34 AM

## 2014-12-10 NOTE — Discharge Summary (Signed)
Please see admission history and physical for full details regarding history of present illness, past medical history, review of systems, physical examination, and admission x-ray and laboratory data. Hospital course. The patient was admitted on 12/08/14 after undergoing an irrigation and debridement with wound exploration of a deep laceration of his left posterior elbow, as well as a closed reduction and splinting of his minimally displaced interarticular fracture of the left distal radius. The patient remained afebrile throughout his postoperative course. His pain was well-controlled with by mouth pain medication. His Hemovac drain was discontinued on the first postoperative day after draining less than 5 cc over the 24-hour period. He was maintained on IV antibiotics for this 24-hour period before being converted to oral antibiotics. His splint has stayed dry and is intact. He has been tolerating food and ambulate without difficulty. He is felt ready for discharge home at this time. Prescriptions for Bactrim DS 1 by mouth twice a day for 2 weeks and oxycodone to take as necessary for pain have been provided. He will return for follow-up in 3-5 days for reevaluation of his wound.

## 2015-04-25 ENCOUNTER — Ambulatory Visit: Payer: No Typology Code available for payment source | Admitting: Pediatrics

## 2015-12-07 ENCOUNTER — Encounter: Payer: Self-pay | Admitting: Physician Assistant

## 2015-12-07 ENCOUNTER — Ambulatory Visit (INDEPENDENT_AMBULATORY_CARE_PROVIDER_SITE_OTHER): Payer: Managed Care, Other (non HMO) | Admitting: Physician Assistant

## 2015-12-07 VITALS — BP 138/72 | HR 88 | Temp 98.6°F | Resp 16 | Ht 72.0 in | Wt 298.0 lb

## 2015-12-07 DIAGNOSIS — K219 Gastro-esophageal reflux disease without esophagitis: Secondary | ICD-10-CM | POA: Diagnosis not present

## 2015-12-07 DIAGNOSIS — F411 Generalized anxiety disorder: Secondary | ICD-10-CM | POA: Insufficient documentation

## 2015-12-07 DIAGNOSIS — M7711 Lateral epicondylitis, right elbow: Secondary | ICD-10-CM

## 2015-12-07 DIAGNOSIS — M25532 Pain in left wrist: Secondary | ICD-10-CM | POA: Insufficient documentation

## 2015-12-07 DIAGNOSIS — Z7189 Other specified counseling: Secondary | ICD-10-CM

## 2015-12-07 DIAGNOSIS — J309 Allergic rhinitis, unspecified: Secondary | ICD-10-CM | POA: Insufficient documentation

## 2015-12-07 DIAGNOSIS — Z7689 Persons encountering health services in other specified circumstances: Secondary | ICD-10-CM

## 2015-12-07 DIAGNOSIS — M7710 Lateral epicondylitis, unspecified elbow: Secondary | ICD-10-CM | POA: Insufficient documentation

## 2015-12-07 MED ORDER — OMEPRAZOLE 40 MG PO CPDR: 40.0000 mg | DELAYED_RELEASE_CAPSULE | Freq: Every day | ORAL | Status: DC

## 2015-12-07 MED ORDER — METHYLPREDNISOLONE ACETATE 40 MG/ML IJ SUSP
40.0000 mg | Freq: Once | INTRAMUSCULAR | Status: AC
  Administered 2015-12-07: 40 mg via INTRA_ARTICULAR

## 2015-12-07 MED ORDER — OXYCODONE HCL 5 MG PO TABS: 5.0000 mg | ORAL_TABLET | ORAL | Status: DC | PRN

## 2015-12-07 MED ORDER — BUPROPION HCL ER (XL) 150 MG PO TB24: 150.0000 mg | ORAL_TABLET | Freq: Every day | ORAL | Status: DC
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
## 2015-12-07 NOTE — Patient Instructions (Signed)
Cortisone Injection, Care After Refer to this sheet in the next few weeks. These instructions provide you with information about caring for yourself after your procedure. Your health care provider may also give you more specific instructions. Your treatment has been planned according to current medical practices, but problems sometimes occur. Call your health care provider if you have any problems or questions after your procedure. WHAT TO EXPECT AFTER THE PROCEDURE After your procedure, it is common to have:  Soreness.  Warmth.  Swelling. You may have more pain, swelling, and warmth than you did before the injection. This reaction may last for about one day.  HOME CARE INSTRUCTIONS Bathing  If you were given a bandage (dressing), keep it dry until your health care provider says it can be removed. Ask your health care provider when you can start showering or taking a bath. Managing Pain, Stiffness, and Swelling  If directed, apply ice to the injection area:  Put ice in a plastic bag.  Place a towel between your skin and the bag.  Leave the ice on for 20 minutes, 2-3 times per day.  Do not apply heat to your knee.  Raise the injection area above the level of your heart while you are sitting or lying down. Activity  Avoid strenuous activities for as long as directed by your health care provider. Ask your health care provider when you can return to your normal activities. General Instructions  Take medicines only as directed by your health care provider.  Do not take aspirin or other over-the-counter medicines unless your health care provider says you can.  Check your injection site every day for signs of infection. Watch for:  Redness, swelling, or pain.  Fluid, blood, or pus.  Follow your health care provider's instructions about dressing changes and removal. SEEK MEDICAL CARE IF:  You have symptoms at your injection site that last longer than two days after your  procedure.  You have redness, swelling, or pain in your injection area.  You have fluid, blood, or pus coming from your injection site.  You have warmth in your injection area.  You have a fever.  Your pain is not controlled with medicine. SEEK IMMEDIATE MEDICAL CARE IF:  Your forearm turns very red.  Your forearm becomes very swollen.  Your forearm pain is severe.   This information is not intended to replace advice given to you by your health care provider. Make sure you discuss any questions you have with your health care provider.   Document Released: 07/16/2014 Document Reviewed: 07/16/2014 Elsevier Interactive Patient Education 2016 [employer]  Lateral Epicondylitis With Rehab Lateral epicondylitis involves inflammation and pain around the outer portion of the elbow. The pain is caused by inflammation of the tendons in the forearm that bring back (extend) the wrist. Lateral epicondylitis is also called tennis elbow, because it is very common in tennis players. However, it may occur in any individual who extends the wrist repetitively. If lateral epicondylitis is left untreated, it may become a chronic problem. SYMPTOMS   Pain, tenderness, and inflammation on the outer (lateral) side of the elbow.  Pain or weakness with gripping activities.  Pain that increases with wrist-twisting motions (playing tennis, using a screwdriver, opening a door or a jar).  Pain with lifting objects, including a coffee cup. CAUSES  Lateral epicondylitis is caused by inflammation of the tendons that extend the wrist. Causes of injury may include:  Repetitive stress and strain on the muscles and tendons that  extend the wrist.  Sudden change in activity level or intensity.  Incorrect grip in racquet sports.  Incorrect grip size of racquet (often too large).  Incorrect hitting position or technique (usually backhand, leading with the elbow).  Using a racket that is too heavy. RISK  INCREASES WITH:  Sports or occupations that require repetitive and/or strenuous forearm and wrist movements (tennis, squash, racquetball, carpentry).  Poor wrist and forearm strength and flexibility.  Failure to warm up properly before activity.  Resuming activity before healing, rehabilitation, and conditioning are complete. PREVENTION   Warm up and stretch properly before activity.  Maintain physical fitness:  Strength, flexibility, and endurance.  Cardiovascular fitness.  Wear and use properly fitted equipment.  Learn and use proper technique and have a coach correct improper technique.  Wear a tennis elbow (counterforce) brace. PROGNOSIS  The course of this condition depends on the degree of the injury. If treated properly, acute cases (symptoms lasting less than 4 weeks) are often resolved in 2 to 6 weeks. Chronic (longer lasting cases) often resolve in 3 to 6 months but may require physical therapy. RELATED COMPLICATIONS   Frequently recurring symptoms, resulting in a chronic problem. Properly treating the problem the first time decreases frequency of recurrence.  Chronic inflammation, scarring tendon degeneration, and partial tendon tear, requiring surgery.  Delayed healing or resolution of symptoms. TREATMENT  Treatment first involves the use of ice and medicine to reduce pain and inflammation. Strengthening and stretching exercises may help reduce discomfort if performed regularly. These exercises may be performed at home if the condition is an acute injury. Chronic cases may require a referral to a physical therapist for evaluation and treatment. Your caregiver may advise a corticosteroid injection to help reduce inflammation. Rarely, surgery is needed. MEDICATION  If pain medicine is needed, nonsteroidal anti-inflammatory medicines (aspirin and ibuprofen), or other minor pain relievers (acetaminophen), are often advised.  Do not take pain medicine for 7 days before  surgery.  Prescription pain relievers may be given, if your caregiver thinks they are needed. Use only as directed and only as much as you need.  Corticosteroid injections may be recommended. These injections should be reserved only for the most severe cases, because they can only be given a certain number of times. HEAT AND COLD  Cold treatment (icing) should be applied for 10 to 15 minutes every 2 to 3 hours for inflammation and pain, and immediately after activity that aggravates your symptoms. Use ice packs or an ice massage.  Heat treatment may be used before performing stretching and strengthening activities prescribed by your caregiver, physical therapist, or athletic trainer. Use a heat pack or a warm water soak. SEEK MEDICAL CARE IF: Symptoms get worse or do not improve in 2 weeks, despite treatment. EXERCISES  RANGE OF MOTION (ROM) AND STRETCHING EXERCISES - Epicondylitis, Lateral (Tennis Elbow) These exercises may help you when beginning to rehabilitate your injury. Your symptoms may go away with or without further involvement from your physician, physical therapist, or athletic trainer. While completing these exercises, remember:   Restoring tissue flexibility helps normal motion to return to the joints. This allows healthier, less painful movement and activity.  An effective stretch should be held for at least 30 seconds.  A stretch should never be painful. You should only feel a gentle lengthening or release in the stretched tissue. RANGE OF MOTION - Wrist Flexion, Active-Assisted  Extend your right / left elbow with your fingers pointing down.*  Gently pull the back  of your hand towards you, until you feel a gentle stretch on the top of your forearm.  Hold this position for __________ seconds. Repeat __________ times. Complete this exercise __________ times per day.  *If directed by your physician, physical therapist or athletic trainer, complete this stretch with your  elbow bent, rather than extended. RANGE OF MOTION - Wrist Extension, Active-Assisted  Extend your right / left elbow and turn your palm upwards.*  Gently pull your palm and fingertips back, so your wrist extends and your fingers point more toward the ground.  You should feel a gentle stretch on the inside of your forearm.  Hold this position for __________ seconds. Repeat __________ times. Complete this exercise __________ times per day. *If directed by your physician, physical therapist or athletic trainer, complete this stretch with your elbow bent, rather than extended. STRETCH - Wrist Flexion  Place the back of your right / left hand on a tabletop, leaving your elbow slightly bent. Your fingers should point away from your body.  Gently press the back of your hand down onto the table by straightening your elbow. You should feel a stretch on the top of your forearm.  Hold this position for __________ seconds. Repeat __________ times. Complete this stretch __________ times per day.  STRETCH - Wrist Extension   Place your right / left fingertips on a tabletop, leaving your elbow slightly bent. Your fingers should point backwards.  Gently press your fingers and palm down onto the table by straightening your elbow. You should feel a stretch on the inside of your forearm.  Hold this position for __________ seconds. Repeat __________ times. Complete this stretch __________ times per day.  STRENGTHENING EXERCISES - Epicondylitis, Lateral (Tennis Elbow) These exercises may help you when beginning to rehabilitate your injury. They may resolve your symptoms with or without further involvement from your physician, physical therapist, or athletic trainer. While completing these exercises, remember:   Muscles can gain both the endurance and the strength needed for everyday activities through controlled exercises.  Complete these exercises as instructed by your physician, physical therapist or  athletic trainer. Increase the resistance and repetitions only as guided.  You may experience muscle soreness or fatigue, but the pain or discomfort you are trying to eliminate should never worsen during these exercises. If this pain does get worse, stop and make sure you are following the directions exactly. If the pain is still present after adjustments, discontinue the exercise until you can discuss the trouble with your caregiver. STRENGTH - Wrist Flexors  Sit with your right / left forearm palm-up and fully supported on a table or countertop. Your elbow should be resting below the height of your shoulder. Allow your wrist to extend over the edge of the surface.  Loosely holding a __________ weight, or a piece of rubber exercise band or tubing, slowly curl your hand up toward your forearm.  Hold this position for __________ seconds. Slowly lower the wrist back to the starting position in a controlled manner. Repeat __________ times. Complete this exercise __________ times per day.  STRENGTH - Wrist Extensors  Sit with your right / left forearm palm-down and fully supported on a table or countertop. Your elbow should be resting below the height of your shoulder. Allow your wrist to extend over the edge of the surface.  Loosely holding a __________ weight, or a piece of rubber exercise band or tubing, slowly curl your hand up toward your forearm.  Hold this position for __________  seconds. Slowly lower the wrist back to the starting position in a controlled manner. Repeat __________ times. Complete this exercise __________ times per day.  STRENGTH - Ulnar Deviators  Stand with a ____________________ weight in your right / left hand, or sit while holding a rubber exercise band or tubing, with your healthy arm supported on a table or countertop.  Move your wrist, so that your pinkie travels toward your forearm and your thumb moves away from your forearm.  Hold this position for __________  seconds and then slowly lower the wrist back to the starting position. Repeat __________ times. Complete this exercise __________ times per day STRENGTH - Radial Deviators  Stand with a ____________________ weight in your right / left hand, or sit while holding a rubber exercise band or tubing, with your injured arm supported on a table or countertop.  Raise your hand upward in front of you or pull up on the rubber tubing.  Hold this position for __________ seconds and then slowly lower the wrist back to the starting position. Repeat __________ times. Complete this exercise __________ times per day. STRENGTH - Forearm Supinators   Sit with your right / left forearm supported on a table, keeping your elbow below shoulder height. Rest your hand over the edge, palm down.  Gently grip a hammer or a soup ladle.  Without moving your elbow, slowly turn your palm and hand upward to a "thumbs-up" position.  Hold this position for __________ seconds. Slowly return to the starting position. Repeat __________ times. Complete this exercise __________ times per day.  STRENGTH - Forearm Pronators   Sit with your right / left forearm supported on a table, keeping your elbow below shoulder height. Rest your hand over the edge, palm up.  Gently grip a hammer or a soup ladle.  Without moving your elbow, slowly turn your palm and hand upward to a "thumbs-up" position.  Hold this position for __________ seconds. Slowly return to the starting position. Repeat __________ times. Complete this exercise __________ times per day.  STRENGTH - Grip  Grasp a tennis ball, a dense sponge, or a large, rolled sock in your hand.  Squeeze as hard as you can, without increasing any pain.  Hold this position for __________ seconds. Release your grip slowly. Repeat __________ times. Complete this exercise __________ times per day.  STRENGTH - Elbow Extensors, Isometric  Stand or sit upright, on a firm surface.  Place your right / left arm so that your palm faces your stomach, and it is at the height of your waist.  Place your opposite hand on the underside of your forearm. Gently push up as your right / left arm resists. Push as hard as you can with both arms, without causing any pain or movement at your right / left elbow. Hold this stationary position for __________ seconds. Gradually release the tension in both arms. Allow your muscles to relax completely before repeating.   This information is not intended to replace advice given to you by your health care provider. Make sure you discuss any questions you have with your health care provider.   Document Released: 06/25/2005 Document Revised: 07/16/2014 Document Reviewed: 10/07/2008 Elsevier Interactive Patient Education 2016 [employer]   Bupropion extended-release tablets (Depression/Mood Disorders) What is this medicine? BUPROPION (byoo PROE pee on) is used to treat depression. This medicine may be used for other purposes; ask your health care provider or pharmacist if you have questions. What should I tell my health care provider  before I take this medicine? They need to know if you have any of these conditions: -an eating disorder, such as anorexia or bulimia -bipolar disorder or psychosis -diabetes or high blood sugar, treated with medication -glaucoma -head injury or brain tumor -heart disease, previous heart attack, or irregular heart beat -high blood pressure -kidney or liver disease -seizures (convulsions) -suicidal thoughts or a previous suicide attempt -Tourette's syndrome -weight loss -an unusual or allergic reaction to bupropion, other medicines, foods, dyes, or preservatives -breast-feeding -pregnant or trying to become pregnant How should I use this medicine? Take this medicine by mouth with a glass of water. Follow the directions on the prescription label. You can take it with or without food. If it upsets your stomach,  take it with food. Do not crush, chew, or cut these tablets. This medicine is taken once daily at the same time each day. Do not take your medicine more often than directed. Do not stop taking this medicine suddenly except upon the advice of your doctor. Stopping this medicine too quickly may cause serious side effects or your condition may worsen. A special MedGuide will be given to you by the pharmacist with each prescription and refill. Be sure to read this information carefully each time. Talk to your pediatrician regarding the use of this medicine in children. Special care may be needed. Overdosage: If you think you have taken too much of this medicine contact a poison control center or emergency room at once. NOTE: This medicine is only for you. Do not share this medicine with others. What if I miss a dose? If you miss a dose, skip the missed dose and take your next tablet at the regular time. Do not take double or extra doses. What may interact with this medicine? Do not take this medicine with any of the following medications: -linezolid -MAOIs like Azilect, Carbex, Eldepryl, Marplan, Nardil, and Parnate -methylene blue (injected into a vein) -other medicines that contain bupropion like Zyban This medicine may also interact with the following medications: -alcohol -certain medicines for anxiety or sleep -certain medicines for blood pressure like metoprolol, propranolol -certain medicines for depression or psychotic disturbances -certain medicines for HIV or AIDS like efavirenz, lopinavir, nelfinavir, ritonavir -certain medicines for irregular heart beat like propafenone, flecainide -certain medicines for Parkinson's disease like amantadine, levodopa -certain medicines for seizures like carbamazepine, phenytoin, phenobarbital -cimetidine -clopidogrel -cyclophosphamide -furazolidone -isoniazid -nicotine -orphenadrine -procarbazine -steroid medicines like prednisone or  cortisone -stimulant medicines for attention disorders, weight loss, or to stay awake -tamoxifen -theophylline -thiotepa -ticlopidine -tramadol -warfarin This list may not describe all possible interactions. Give your health care provider a list of all the medicines, herbs, non-prescription drugs, or dietary supplements you use. Also tell them if you smoke, drink alcohol, or use illegal drugs. Some items may interact with your medicine. What should I watch for while using this medicine? Tell your doctor if your symptoms do not get better or if they get worse. Visit your doctor or health care professional for regular checks on your progress. Because it may take several weeks to see the full effects of this medicine, it is important to continue your treatment as prescribed by your doctor. Patients and their families should watch out for new or worsening thoughts of suicide or depression. Also watch out for sudden changes in feelings such as feeling anxious, agitated, panicky, irritable, hostile, aggressive, impulsive, severely restless, overly excited and hyperactive, or not being able to sleep. If this happens, especially at the beginning  of treatment or after a change in dose, call your health care professional. Avoid alcoholic drinks while taking this medicine. Drinking large amounts of alcoholic beverages, using sleeping or anxiety medicines, or quickly stopping the use of these agents while taking this medicine may increase your risk for a seizure. Do not drive or use heavy machinery until you know how this medicine affects you. This medicine can impair your ability to perform these tasks. Do not take this medicine close to bedtime. It may prevent you from sleeping. Your mouth may get dry. Chewing sugarless gum or sucking hard candy, and drinking plenty of water may help. Contact your doctor if the problem does not go away or is severe. The tablet shell for some brands of this medicine does not  dissolve. This is normal. The tablet shell may appear whole in the stool. This is not a cause for concern. What side effects may I notice from receiving this medicine? Side effects that you should report to your doctor or health care professional as soon as possible: -allergic reactions like skin rash, itching or hives, swelling of the face, lips, or tongue -breathing problems -changes in vision -confusion -fast or irregular heartbeat -hallucinations -increased blood pressure -redness, blistering, peeling or loosening of the skin, including inside the mouth -seizures -suicidal thoughts or other mood changes -unusually weak or tired -vomiting Side effects that usually do not require medical attention (report to your doctor or health care professional if they continue or are bothersome): -change in sex drive or performance -constipation -headache -loss of appetite -nausea -tremors -weight loss This list may not describe all possible side effects. Call your doctor for medical advice about side effects. You may report side effects to FDA at 1-800-FDA-1088. Where should I keep my medicine? Keep out of the reach of children. Store at room temperature between 15 and 30 degrees C (59 and 86 degrees F). Throw away any unused medicine after the expiration date. NOTE: This sheet is a summary. It may not cover all possible information. If you have questions about this medicine, talk to your doctor, pharmacist, or health care provider.    2016, [employer]. (2013-01-16 12:39:42)

## 2016-01-04 ENCOUNTER — Encounter: Payer: Self-pay | Admitting: Physician Assistant

## 2016-01-04 ENCOUNTER — Ambulatory Visit (INDEPENDENT_AMBULATORY_CARE_PROVIDER_SITE_OTHER): Payer: Managed Care, Other (non HMO) | Admitting: Physician Assistant

## 2016-01-04 VITALS — BP 120/82 | HR 88 | Temp 97.7°F | Resp 16 | Wt 299.6 lb

## 2016-01-04 DIAGNOSIS — M25532 Pain in left wrist: Secondary | ICD-10-CM

## 2016-01-04 DIAGNOSIS — K219 Gastro-esophageal reflux disease without esophagitis: Secondary | ICD-10-CM

## 2016-01-04 DIAGNOSIS — F411 Generalized anxiety disorder: Secondary | ICD-10-CM

## 2016-01-04 MED ORDER — BUPROPION HCL ER (SR) 150 MG PO TB12: 150.0000 mg | ORAL_TABLET | Freq: Two times a day (BID) | ORAL | Status: DC

## 2016-01-04 MED ORDER — OXYCODONE HCL 5 MG PO TABS: 5.0000 mg | ORAL_TABLET | Freq: Four times a day (QID) | ORAL | Status: DC | PRN

## 2016-01-04 MED ORDER — PANTOPRAZOLE SODIUM 40 MG PO TBEC: 40.0000 mg | DELAYED_RELEASE_TABLET | Freq: Every day | ORAL | Status: DC

## 2016-01-04 NOTE — Progress Notes (Signed)
Patient: Francisco Oneal Male    DOB: 02-Apr-1971   45 y.o.   MRN: [id number] Visit Date: 01/04/2016  Today's Provider: Mar Daring, PA-C   Chief Complaint  Patient presents with  . Follow-up    GAD, GERD, Right elbow.   Subjective:    HPI GAD: Patient is here to follow-up on anxiety. Last office visit his symptoms were worsening and was having stress at work. He was prescribed Bupropion. He reports today that he really can't see the difference but he is trying his best to keep focused. He feels his irritability is a little better. He feels anxious/nervous a little. Reports that his wife has not mentioned anything.  GERD: Patient was seen on 05/31 for worsening acid reflux. He was prescribed Prilosec. He feels that the Prilosec is helping a little. He still gets a little acid reflux off and on. He is trying to not eat spicy foods or spices.  Right Elbow: He reports is doing better. He takes Ibuprofen prn.     Allergies  Allergen Reactions  . Bee Venom Anaphylaxis  . Penicillins Other (See Comments)    Reaction:  Unknown    Current Meds  Medication Sig  . buPROPion (WELLBUTRIN XL) 150 MG 24 hr tablet Take 1 tablet (150 mg total) by mouth daily.  Marland Kitchen omeprazole (PRILOSEC) 40 MG capsule Take 1 capsule (40 mg total) by mouth daily.  Marland Kitchen oxyCODONE (OXY IR/ROXICODONE) 5 MG immediate release tablet Take 1-2 tablets (5-10 mg total) by mouth every 4 (four) hours as needed for breakthrough pain.    Review of Systems  Constitutional: Negative.   Respiratory: Negative.   Cardiovascular: Negative.   Gastrointestinal: Negative.   Musculoskeletal: Positive for arthralgias (right elbow medial side hurting more now; lateral side better after injection).  Neurological: Negative.   Psychiatric/Behavioral: Positive for agitation (improving). Negative for dysphoric mood. The patient is not nervous/anxious.     Social History  Substance Use Topics  . Smoking status: Never Smoker   .  Smokeless tobacco: Not on file  . Alcohol Use: 4.8 oz/week    8 Glasses of wine per week   Objective:   BP 120/82 mmHg  Pulse 88  Temp(Src) 97.7 F (36.5 C) (Oral)  Resp 16  Wt 299 lb 9.6 oz (135.898 kg)  Physical Exam  Constitutional: He appears well-developed and well-nourished. No distress.  HENT:  Head: Normocephalic and atraumatic.  Neck: Normal range of motion. Neck supple.  Cardiovascular: Normal rate, regular rhythm and normal heart sounds.  Exam reveals no gallop and no friction rub.   No murmur heard. Pulmonary/Chest: Effort normal and breath sounds normal. No respiratory distress. He has no wheezes. He has no rales.  Abdominal: Soft. Bowel sounds are normal. There is no tenderness.  Skin: He is not diaphoretic.  Psychiatric: He has a normal mood and affect. His behavior is normal. Judgment and thought content normal.  Vitals reviewed.     Assessment & Plan:     1. GAD (generalized anxiety disorder) Improving but not quite completely controlled. Will increase to wellbutrin 122m BID. He is to call if symptoms worsen or fail to improve. Will f/u in 3 months. - buPROPion (WELLBUTRIN SR) 150 MG 12 hr tablet; Take 1 tablet (150 mg total) by mouth 2 (two) times daily.  Dispense: 60 tablet; Refill: 3  2. Gastroesophageal reflux disease without esophagitis Not completely improved with omeprazole 465m Will switch to protonix 4038maily. Will f/u in  3 months. He is to call if symptoms worsen or fail to improve. - pantoprazole (PROTONIX) 40 MG tablet; Take 1 tablet (40 mg total) by mouth daily.  Dispense: 30 tablet; Refill: 3  3. Left wrist pain Stable. Diagnosis pulled for medication refill. Continue current medical treatment plan. He uses pain medication prn when IBU does not cut pain. - oxyCODONE (OXY IR/ROXICODONE) 5 MG immediate release tablet; Take 1-2 tablets (5-10 mg total) by mouth every 6 (six) hours as needed for breakthrough pain.  Dispense: 60 tablet; Refill: 0        Mar Daring, PA-C  [facility]

## 2016-01-04 NOTE — Patient Instructions (Signed)
Pantoprazole tablets What is this medicine? PANTOPRAZOLE (pan TOE pra zole) prevents the production of acid in the stomach. It is used to treat gastroesophageal reflux disease (GERD), inflammation of the esophagus, and Zollinger-Ellison syndrome. This medicine may be used for other purposes; ask your health care provider or pharmacist if you have questions. What should I tell my health care provider before I take this medicine? They need to know if you have any of these conditions: -liver disease -low levels of magnesium in the blood -an unusual or allergic reaction to omeprazole, lansoprazole, pantoprazole, rabeprazole, other medicines, foods, dyes, or preservatives -pregnant or trying to get pregnant -breast-feeding How should I use this medicine? Take this medicine by mouth. Swallow the tablets whole with a drink of water. Follow the directions on the prescription label. Do not crush, break, or chew. Take your medicine at regular intervals. Do not take your medicine more often than directed. Talk to your pediatrician regarding the use of this medicine in children. While this drug may be prescribed for children as young as 5 years for selected conditions, precautions do apply. Overdosage: If you think you have taken too much of this medicine contact a poison control center or emergency room at once. NOTE: This medicine is only for you. Do not share this medicine with others. What if I miss a dose? If you miss a dose, take it as soon as you can. If it is almost time for your next dose, take only that dose. Do not take double or extra doses. What may interact with this medicine? Do not take this medicine with any of the following medications: -atazanavir -nelfinavir This medicine may also interact with the following medications: -ampicillin -delavirdine -erlotinib -iron salts -medicines for fungal infections like ketoconazole, itraconazole and voriconazole -methotrexate -mycophenolate  mofetil -warfarin This list may not describe all possible interactions. Give your health care provider a list of all the medicines, herbs, non-prescription drugs, or dietary supplements you use. Also tell them if you smoke, drink alcohol, or use illegal drugs. Some items may interact with your medicine. What should I watch for while using this medicine? It can take several days before your stomach pain gets better. Check with your doctor or health care professional if your condition does not start to get better, or if it gets worse. You may need blood work done while you are taking this medicine. What side effects may I notice from receiving this medicine? Side effects that you should report to your doctor or health care professional as soon as possible: -allergic reactions like skin rash, itching or hives, swelling of the face, lips, or tongue -bone, muscle or joint pain -breathing problems -chest pain or chest tightness -dark yellow or brown urine -dizziness -fast, irregular heartbeat -feeling faint or lightheaded -fever or sore throat -muscle spasm -palpitations -redness, blistering, peeling or loosening of the skin, including inside the mouth -seizures -tremors -unusual bleeding or bruising -unusually weak or tired -yellowing of the eyes or skin Side effects that usually do not require medical attention (Report these to your doctor or health care professional if they continue or are bothersome.): -constipation -diarrhea -dry mouth -headache -nausea This list may not describe all possible side effects. Call your doctor for medical advice about side effects. You may report side effects to FDA at 1-800-FDA-1088. Where should I keep my medicine? Keep out of the reach of children. Store at room temperature between 15 and 30 degrees C (59 and 86 degrees F). Protect from light  and moisture. Throw away any unused medicine after the expiration date. NOTE: This sheet is a summary. It may  not cover all possible information. If you have questions about this medicine, talk to your doctor, pharmacist, or health care provider.    2016, Elsevier/Gold Standard. (2014-08-13 14:45:56)

## 2016-02-28 ENCOUNTER — Other Ambulatory Visit: Payer: Self-pay | Admitting: Physician Assistant

## 2016-02-28 ENCOUNTER — Encounter: Payer: Self-pay | Admitting: Physician Assistant

## 2016-02-28 DIAGNOSIS — F419 Anxiety disorder, unspecified: Secondary | ICD-10-CM

## 2016-02-28 DIAGNOSIS — M25532 Pain in left wrist: Secondary | ICD-10-CM

## 2016-02-28 MED ORDER — ALPRAZOLAM 0.5 MG PO TABS: 0.2500 mg | ORAL_TABLET | Freq: Two times a day (BID) | ORAL | 0 refills | Status: DC | PRN

## 2016-02-28 MED ORDER — OXYCODONE HCL 5 MG PO TABS: 5.0000 mg | ORAL_TABLET | Freq: Four times a day (QID) | ORAL | 0 refills | Status: DC | PRN

## 2016-02-29 ENCOUNTER — Encounter: Payer: Self-pay | Admitting: Physician Assistant

## 2016-02-29 NOTE — Telephone Encounter (Signed)
Medication was filled yesterday

## 2016-03-01 ENCOUNTER — Encounter: Payer: Self-pay | Admitting: Physician Assistant

## 2016-04-05 ENCOUNTER — Ambulatory Visit: Payer: Managed Care, Other (non HMO) | Admitting: Physician Assistant

## 2016-04-09 ENCOUNTER — Ambulatory Visit (INDEPENDENT_AMBULATORY_CARE_PROVIDER_SITE_OTHER): Payer: Managed Care, Other (non HMO) | Admitting: Physician Assistant

## 2016-04-09 ENCOUNTER — Encounter: Payer: Self-pay | Admitting: Physician Assistant

## 2016-04-09 VITALS — BP 126/80 | HR 80 | Temp 97.7°F | Resp 16 | Ht 72.0 in | Wt 312.0 lb

## 2016-04-09 DIAGNOSIS — R635 Abnormal weight gain: Secondary | ICD-10-CM | POA: Diagnosis not present

## 2016-04-09 DIAGNOSIS — F411 Generalized anxiety disorder: Secondary | ICD-10-CM

## 2016-04-09 DIAGNOSIS — K219 Gastro-esophageal reflux disease without esophagitis: Secondary | ICD-10-CM

## 2016-04-09 DIAGNOSIS — R6882 Decreased libido: Secondary | ICD-10-CM

## 2016-04-09 DIAGNOSIS — Z23 Encounter for immunization: Secondary | ICD-10-CM

## 2016-04-09 DIAGNOSIS — F419 Anxiety disorder, unspecified: Secondary | ICD-10-CM

## 2016-04-09 DIAGNOSIS — Z13220 Encounter for screening for lipoid disorders: Secondary | ICD-10-CM

## 2016-04-09 DIAGNOSIS — R5383 Other fatigue: Secondary | ICD-10-CM

## 2016-04-09 DIAGNOSIS — Z136 Encounter for screening for cardiovascular disorders: Secondary | ICD-10-CM | POA: Diagnosis not present

## 2016-04-09 DIAGNOSIS — M25532 Pain in left wrist: Secondary | ICD-10-CM | POA: Diagnosis not present

## 2016-04-09 MED ORDER — PANTOPRAZOLE SODIUM 40 MG PO TBEC: 40.0000 mg | DELAYED_RELEASE_TABLET | Freq: Every day | ORAL | 1 refills | Status: DC

## 2016-04-09 MED ORDER — OXYCODONE HCL 5 MG PO TABS: 5.0000 mg | ORAL_TABLET | Freq: Four times a day (QID) | ORAL | 0 refills | Status: DC | PRN

## 2016-04-09 MED ORDER — BUPROPION HCL ER (SR) 150 MG PO TB12: 150.0000 mg | ORAL_TABLET | Freq: Two times a day (BID) | ORAL | 1 refills | Status: DC

## 2016-04-09 MED ORDER — ALPRAZOLAM 0.5 MG PO TABS: 0.2500 mg | ORAL_TABLET | Freq: Two times a day (BID) | ORAL | 4 refills | Status: DC | PRN

## 2016-04-09 NOTE — Patient Instructions (Signed)

## 2016-04-09 NOTE — Progress Notes (Signed)
Patient: Francisco Oneal Male    DOB: [birth year]   45 y.o.   MRN: [id number] Visit Date: 04/09/2016  Today's Provider: Mar Daring, PA-C   Chief Complaint  Patient presents with  . Gastroesophageal Reflux  . Anxiety   Subjective:    HPI  GERD, Follow up:  The patient was last seen for GERD 3 months ago. Changes made since that visit include start Pantoprazole 40 mg.  He reports excellent compliance with treatment. He is not having side effects. Marland Kitchen  He IS experiencing none. He is NOT experiencing chest pain  ------------------------------------------------------------------------    Anxiety, Follow-up  He  was last seen for this 3 months ago. Changes made at last visit include Wellbutrin increased to 150 mg BID.   He reports excellent compliance with treatment. He is not having side effects.   He reports excellent tolerance of treatment. Current symptoms include: none He feels he is Improved since last visit.  ------------------------------------------------------------------------ Weight Gain: He is also concerned of weight gain. He has gained approximately 13 pounds since he started coming here (about 6 months). He reports he does not eat much. He has a fairly active job, but does not exercise regularly. He does not keep a food diary. Labs have not been checked recently. With weight gain he does have associated fatigue, decreased libido.       Allergies  Allergen Reactions  . Bee Venom Anaphylaxis  . Penicillins Other (See Comments)    Reaction:  Unknown      Current Outpatient Prescriptions:  .  ALPRAZolam (XANAX) 0.5 MG tablet, Take 0.5-1 tablets (0.25-0.5 mg total) by mouth 2 (two) times daily as needed for anxiety., Disp: 60 tablet, Rfl: 0 .  buPROPion (WELLBUTRIN SR) 150 MG 12 hr tablet, Take 1 tablet (150 mg total) by mouth 2 (two) times daily., Disp: 60 tablet, Rfl: 3 .  oxyCODONE (OXY IR/ROXICODONE) 5 MG immediate release tablet, Take  1-2 tablets (5-10 mg total) by mouth every 6 (six) hours as needed for breakthrough pain., Disp: 60 tablet, Rfl: 0 .  pantoprazole (PROTONIX) 40 MG tablet, Take 1 tablet (40 mg total) by mouth daily., Disp: 30 tablet, Rfl: 3  Review of Systems  Constitutional: Positive for fatigue and unexpected weight change. Negative for appetite change, diaphoresis and fever.  HENT: Negative for trouble swallowing.   Respiratory: Negative.   Cardiovascular: Negative.   Gastrointestinal: Negative.   Neurological: Negative.   Psychiatric/Behavioral: Positive for decreased concentration. Negative for agitation, dysphoric mood and sleep disturbance. The patient is not nervous/anxious.     Social History  Substance Use Topics  . Smoking status: Never Smoker  . Smokeless tobacco: Never Used  . Alcohol use 4.8 oz/week    8 Glasses of wine per week   Objective:   BP 126/80 (BP Location: Left Arm, Patient Position: Sitting, Cuff Size: Large)   Pulse 80   Temp 97.7 F (36.5 C) (Oral)   Resp 16   Ht 6' (1.829 m)   Wt (!) 312 lb (141.5 kg)   BMI 42.31 kg/m   Physical Exam  Constitutional: He appears well-developed and well-nourished. No distress.  HENT:  Head: Normocephalic and atraumatic.  Neck: Normal range of motion. Neck supple. No JVD present. No tracheal deviation present. No thyromegaly present.  Cardiovascular: Normal rate, regular rhythm and normal heart sounds.  Exam reveals no gallop and no friction rub.   No murmur heard. Pulmonary/Chest: Effort normal and breath sounds normal. No  respiratory distress. He has no wheezes. He has no rales.  Musculoskeletal: He exhibits no edema.  Lymphadenopathy:    He has no cervical adenopathy.  Skin: He is not diaphoretic.  Psychiatric: He has a normal mood and affect. His behavior is normal. Judgment and thought content normal.  Vitals reviewed.     Assessment & Plan:     1. Gastroesophageal reflux disease without esophagitis Stable. Diagnosis  pulled for medication refill. Continue current medical treatment plan. - pantoprazole (PROTONIX) 40 MG tablet; Take 1 tablet (40 mg total) by mouth daily.  Dispense: 90 tablet; Refill: 1  2. GAD (generalized anxiety disorder) Stable. Diagnosis pulled for medication refill. Continue current medical treatment plan. - buPROPion (WELLBUTRIN SR) 150 MG 12 hr tablet; Take 1 tablet (150 mg total) by mouth 2 (two) times daily.  Dispense: 180 tablet; Refill: 1 - ALPRAZolam (XANAX) 0.5 MG tablet; Take 0.5-1 tablets (0.25-0.5 mg total) by mouth 2 (two) times daily as needed for anxiety.  Dispense: 60 tablet; Refill: 4  3. Need for influenza vaccination Flu vaccine given today without complication. Patient sat upright for 15 minutes to check for adverse reaction before being released. - Flu Vaccine QUAD 36+ mos IM  4. Weight gain Unknown cause. Will check labs and f/u pending lab results.  - TSH - Comprehensive Metabolic Panel (CMET) - HgB A1c - Testosterone  5. Fatigue, unspecified type Unknown cause. Will check labs as below and f/u pending results. - CBC with Differential - TSH - Comprehensive Metabolic Panel (CMET) - Testosterone  6. Decreased libido Will check labs as below and f/u pending results. - Testosterone  7. Encounter for lipid screening for cardiovascular disease No lipid check and weight gain. Will check labs as below and f/u pending results. - Lipid Profile  8. Left wrist pain Stable. Diagnosis pulled for medication refill. Continue current medical treatment plan. - oxyCODONE (OXY IR/ROXICODONE) 5 MG immediate release tablet; Take 1-2 tablets (5-10 mg total) by mouth every 6 (six) hours as needed for breakthrough pain.  Dispense: 60 tablet; Refill: 0       Mar Daring, PA-C  [facility]

## 2016-04-10 ENCOUNTER — Encounter: Payer: Self-pay | Admitting: Physician Assistant

## 2016-04-10 LAB — CBC WITH DIFFERENTIAL/PLATELET
Basophils Absolute: 0 10*3/uL (ref 0.0–0.2)
Basos: 0 %
EOS (ABSOLUTE): 0.2 10*3/uL (ref 0.0–0.4)
Eos: 3 %
Hematocrit: 47.4 % (ref 37.5–51.0)
Hemoglobin: 15.8 g/dL (ref 12.6–17.7)
Immature Grans (Abs): 0 10*3/uL (ref 0.0–0.1)
Immature Granulocytes: 1 %
Lymphocytes Absolute: 3 10*3/uL (ref 0.7–3.1)
Lymphs: 38 %
MCH: 31.5 pg (ref 26.6–33.0)
MCHC: 33.3 g/dL (ref 31.5–35.7)
MCV: 95 fL (ref 79–97)
Monocytes Absolute: 0.8 10*3/uL (ref 0.1–0.9)
Monocytes: 10 %
Neutrophils Absolute: 3.8 10*3/uL (ref 1.4–7.0)
Neutrophils: 48 %
Platelets: 211 10*3/uL (ref 150–379)
RBC: 5.01 x10E6/uL (ref 4.14–5.80)
RDW: 13 % (ref 12.3–15.4)
WBC: 7.9 10*3/uL (ref 3.4–10.8)

## 2016-04-10 LAB — COMPREHENSIVE METABOLIC PANEL
ALT: 98 IU/L — ABNORMAL HIGH (ref 0–44)
AST: 42 IU/L — ABNORMAL HIGH (ref 0–40)
Albumin/Globulin Ratio: 1.6 (ref 1.2–2.2)
Albumin: 4.3 g/dL (ref 3.5–5.5)
Alkaline Phosphatase: 60 IU/L (ref 39–117)
BUN/Creatinine Ratio: 14 (ref 9–20)
BUN: 13 mg/dL (ref 6–24)
Bilirubin Total: 0.4 mg/dL (ref 0.0–1.2)
CO2: 24 mmol/L (ref 18–29)
Calcium: 9.4 mg/dL (ref 8.7–10.2)
Chloride: 101 mmol/L (ref 96–106)
Creatinine, Ser: 0.96 mg/dL (ref 0.76–1.27)
GFR calc Af Amer: 109 mL/min/{1.73_m2} (ref >59)
GFR calc non Af Amer: 94 mL/min/{1.73_m2} (ref >59)
Globulin, Total: 2.7 g/dL (ref 1.5–4.5)
Glucose: 110 mg/dL — ABNORMAL HIGH (ref 65–99)
Potassium: 4.3 mmol/L (ref 3.5–5.2)
Sodium: 140 mmol/L (ref 134–144)
Total Protein: 7 g/dL (ref 6.0–8.5)

## 2016-04-10 LAB — LIPID PANEL
Chol/HDL Ratio: 5 ratio units (ref 0.0–5.0)
Cholesterol, Total: 222 mg/dL — ABNORMAL HIGH (ref 100–199)
HDL: 44 mg/dL (ref >39)
LDL Calculated: 126 mg/dL — ABNORMAL HIGH (ref 0–99)
Triglycerides: 259 mg/dL — ABNORMAL HIGH (ref 0–149)
VLDL Cholesterol Cal: 52 mg/dL — ABNORMAL HIGH (ref 5–40)

## 2016-04-10 LAB — TESTOSTERONE: Testosterone: 331 ng/dL (ref 264–916)

## 2016-04-10 LAB — TSH: TSH: 2.6 u[IU]/mL (ref 0.450–4.500)

## 2016-04-10 LAB — HEMOGLOBIN A1C
Est. average glucose Bld gHb Est-mCnc: 105 mg/dL
Hgb A1c MFr Bld: 5.3 % (ref 4.8–5.6)

## 2016-04-11 ENCOUNTER — Telehealth: Payer: Self-pay

## 2016-04-11 NOTE — Telephone Encounter (Signed)
-----   Message from Mar Daring, [place] sent at 04/10/2016 12:59 PM EDT ----- All labs are within normal limits and stable with exception of cholesterol which is borderline high. Need to limit fatty foods, foods high in cholesterol and carbohydrates. Try to increase physical activity to get in 150 minutes of moderate activity per week. Testosterone was a little on the low side but still WNL. This can effect metabolism but because it is still normal range insurance will not cover any treatment for it. Liver enzymes are elevated but fairly stable from 2 years ago. HgBA1c is normal. Try food diary to see how much you are truly taking in, then try to limit or increase depending on calorie count that is being took in. Do food diary and exercise together for 4-6 weeks to see if you notice any weight loss. Thanks! -JB

## 2016-04-11 NOTE — Telephone Encounter (Signed)
Pt called saying he has communicated with Sonia Baller via email and knows the results and you do not need to call him back.  ThanksTeri

## 2016-04-11 NOTE — Telephone Encounter (Signed)
LMTCB

## 2016-05-14 ENCOUNTER — Encounter: Payer: Self-pay | Admitting: Physician Assistant

## 2016-05-14 ENCOUNTER — Ambulatory Visit (INDEPENDENT_AMBULATORY_CARE_PROVIDER_SITE_OTHER): Payer: Managed Care, Other (non HMO) | Admitting: Physician Assistant

## 2016-05-14 VITALS — BP 118/88 | HR 84 | Temp 98.0°F | Resp 16 | Wt 303.0 lb

## 2016-05-14 DIAGNOSIS — R197 Diarrhea, unspecified: Secondary | ICD-10-CM | POA: Diagnosis not present

## 2016-05-14 MED ORDER — METRONIDAZOLE 500 MG PO TABS: 500.0000 mg | ORAL_TABLET | Freq: Two times a day (BID) | ORAL | 0 refills | Status: DC

## 2016-05-14 NOTE — Patient Instructions (Addendum)
Diarrhea Diarrhea is frequent loose and watery bowel movements. It can cause you to feel weak and dehydrated. Dehydration can cause you to become tired and thirsty, have a dry mouth, and have decreased urination that often is dark yellow. Diarrhea is a sign of another problem, most often an infection that will not last long. In most cases, diarrhea typically lasts 2-3 days. However, it can last longer if it is a sign of something more serious. It is important to treat your diarrhea as directed by your caregiver to lessen or prevent future episodes of diarrhea. CAUSES  Some common causes include:  Gastrointestinal infections caused by viruses, bacteria, or parasites.  Food poisoning or food allergies.  Certain medicines, such as antibiotics, chemotherapy, and laxatives.  Artificial sweeteners and fructose.  Digestive disorders. HOME CARE INSTRUCTIONS  Ensure adequate fluid intake (hydration): Have 1 cup (8 oz) of fluid for each diarrhea episode. Avoid fluids that contain simple sugars or sports drinks, fruit juices, whole milk products, and sodas. Your urine should be clear or pale yellow if you are drinking enough fluids. Hydrate with an oral rehydration solution that you can purchase at pharmacies, retail stores, and online. You can prepare an oral rehydration solution at home by mixing the following ingredients together:   - tsp table salt.   tsp baking soda.   tsp salt substitute containing potassium chloride.  1  tablespoons sugar.  1 L (34 oz) of water.  Certain foods and beverages may increase the speed at which food moves through the gastrointestinal (GI) tract. These foods and beverages should be avoided and include:  Caffeinated and alcoholic beverages.  High-fiber foods, such as raw fruits and vegetables, nuts, seeds, and whole grain breads and cereals.  Foods and beverages sweetened with sugar alcohols, such as xylitol, sorbitol, and mannitol.  Some foods may be well  tolerated and may help thicken stool including:  Starchy foods, such as rice, toast, pasta, low-sugar cereal, oatmeal, grits, baked potatoes, crackers, and bagels.  Bananas.  Applesauce.  Add probiotic-rich foods to help increase healthy bacteria in the GI tract, such as yogurt and fermented milk products.  Wash your hands well after each diarrhea episode.  Only take over-the-counter or prescription medicines as directed by your caregiver.  Take a warm bath to relieve any burning or pain from frequent diarrhea episodes. SEEK IMMEDIATE MEDICAL CARE IF:   You are unable to keep fluids down.  You have persistent vomiting.  You have blood in your stool, or your stools are black and tarry.  You do not urinate in 6-8 hours, or there is only a small amount of very dark urine.  You have abdominal pain that increases or localizes.  You have weakness, dizziness, confusion, or light-headedness.  You have a severe headache.  Your diarrhea gets worse or does not get better.  You have a fever or persistent symptoms for more than 2-3 days.  You have a fever and your symptoms suddenly get worse. MAKE SURE YOU:   Understand these instructions.  Will watch your condition.  Will get help right away if you are not doing well or get worse.   This information is not intended to replace advice given to you by your health care provider. Make sure you discuss any questions you have with your health care provider.   Document Released: 06/15/2002 Document Revised: 07/16/2014 Document Reviewed: 03/02/2012 Elsevier Interactive Patient Education 2016 [place] Choices to Help Relieve Diarrhea, Adult When you have  diarrhea, the foods you eat and your eating habits are very important. Choosing the right foods and drinks can help relieve diarrhea. Also, because diarrhea can last up to 7 days, you need to replace lost fluids and electrolytes (such as sodium, potassium, and chloride) in  order to help prevent dehydration.  WHAT GENERAL GUIDELINES DO I NEED TO FOLLOW?  Slowly drink 1 cup (8 oz) of fluid for each episode of diarrhea. If you are getting enough fluid, your urine will be clear or pale yellow.  Eat starchy foods. Some good choices include white rice, white toast, pasta, low-fiber cereal, baked potatoes (without the skin), saltine crackers, and bagels.  Avoid large servings of any cooked vegetables.  Limit fruit to two servings per day. A serving is  cup or 1 small piece.  Choose foods with less than 2 g of fiber per serving.  Limit fats to less than 8 tsp (38 g) per day.  Avoid fried foods.  Eat foods that have probiotics in them. Probiotics can be found in certain dairy products.  Avoid foods and beverages that may increase the speed at which food moves through the stomach and intestines (gastrointestinal tract). Things to avoid include:  High-fiber foods, such as dried fruit, raw fruits and vegetables, nuts, seeds, and whole grain foods.  Spicy foods and high-fat foods.  Foods and beverages sweetened with high-fructose corn syrup, honey, or sugar alcohols such as xylitol, sorbitol, and mannitol. WHAT FOODS ARE RECOMMENDED? Grains White rice. White, [place], or pita breads (fresh or toasted), including plain rolls, buns, or bagels. White pasta. Saltine, soda, or graham crackers. Pretzels. Low-fiber cereal. Cooked cereals made with water (such as cornmeal, farina, or cream cereals). Plain muffins. Matzo. Melba toast. Zwieback.  Vegetables Potatoes (without the skin). Strained tomato and vegetable juices. Most well-cooked and canned vegetables without seeds. Tender lettuce. Fruits Cooked or canned applesauce, apricots, cherries, fruit cocktail, grapefruit, peaches, pears, or plums. Fresh bananas, apples without skin, cherries, grapes, cantaloupe, grapefruit, peaches, oranges, or plums.  Meat and Other Protein Products Baked or boiled chicken. Eggs. Tofu.  Fish. Seafood. Smooth peanut butter. Ground or well-cooked tender beef, ham, veal, lamb, pork, or poultry.  Dairy Plain yogurt, kefir, and unsweetened liquid yogurt. Lactose-free milk, buttermilk, or soy milk. Plain hard cheese. Beverages Sport drinks. Clear broths. Diluted fruit juices (except prune). Regular, caffeine-free sodas such as ginger ale. Water. Decaffeinated teas. Oral rehydration solutions. Sugar-free beverages not sweetened with sugar alcohols. Other Bouillon, broth, or soups made from recommended foods.  The items listed above may not be a complete list of recommended foods or beverages. Contact your dietitian for more options. WHAT FOODS ARE NOT RECOMMENDED? Grains Whole grain, whole wheat, bran, or rye breads, rolls, pastas, crackers, and cereals. Wild or brown rice. Cereals that contain more than 2 g of fiber per serving. Corn tortillas or taco shells. Cooked or dry oatmeal. Granola. Popcorn. Vegetables Raw vegetables. Cabbage, broccoli, Brussels sprouts, artichokes, baked beans, beet greens, corn, kale, legumes, peas, sweet potatoes, and yams. Potato skins. Cooked spinach and cabbage. Fruits Dried fruit, including raisins and dates. Raw fruits. Stewed or dried prunes. Fresh apples with skin, apricots, mangoes, pears, raspberries, and strawberries.  Meat and Other Protein Products Chunky peanut butter. Nuts and seeds. Beans and lentils. Berniece Salines.  Dairy High-fat cheeses. Milk, chocolate milk, and beverages made with milk, such as milk shakes. Cream. Ice cream. Sweets and Desserts Sweet rolls, doughnuts, and sweet breads. Pancakes and waffles. Fats and Oils Butter. Cream sauces.  Margarine. Salad oils. Plain salad dressings. Olives. Avocados.  Beverages Caffeinated beverages (such as coffee, tea, soda, or energy drinks). Alcoholic beverages. Fruit juices with pulp. Prune juice. Soft drinks sweetened with high-fructose corn syrup or sugar alcohols. Other Coconut. Hot sauce.  Chili powder. Mayonnaise. Gravy. Cream-based or milk-based soups.  The items listed above may not be a complete list of foods and beverages to avoid. Contact your dietitian for more information. WHAT SHOULD I DO IF I BECOME DEHYDRATED? Diarrhea can sometimes lead to dehydration. Signs of dehydration include dark urine and dry mouth and skin. If you think you are dehydrated, you should rehydrate with an oral rehydration solution. These solutions can be purchased at pharmacies, retail stores, or online.  Drink -1 cup (120-240 mL) of oral rehydration solution each time you have an episode of diarrhea. If drinking this amount makes your diarrhea worse, try drinking smaller amounts more often. For example, drink 1-3 tsp (5-15 mL) every 5-10 minutes.  A general rule for staying hydrated is to drink 1-2 L of fluid per day. Talk to your health care provider about the specific amount you should be drinking each day. Drink enough fluids to keep your urine clear or pale yellow.   This information is not intended to replace advice given to you by your health care provider. Make sure you discuss any questions you have with your health care provider.   Document Released: 09/15/2003 Document Revised: 07/16/2014 Document Reviewed: 05/18/2013 Elsevier Interactive Patient Education [employer].

## 2016-05-14 NOTE — Progress Notes (Signed)
Patient: Francisco Oneal Male    DOB: [birth year]   45 y.o.   MRN: [id number] Visit Date: 05/14/2016  Today's Provider: Mar Daring, PA-C   Chief Complaint  Patient presents with  . Abdominal Pain   Subjective:    HPI Abdominal Pain: Patient complains of abdominal pain and diarrhea. The pain is described as aching and cramping, and is 3/10 in intensity. Pain is located in the epigastric without radiation. Onset was 5 days ago. Symptoms have been gradually improving since. Aggravating factors: eating and odors.  Alleviating factors: none. Associated symptoms: chills. The patient denies fever. Patient reports he traveled to [place], came back on Wednesday and started feeling sick on Thursday night. Patient reports taking Tylenol for pain, reports mild relief. Unknown fever, one episode of vomiting. Saturday and Sunday worst days. Friday able to still work, thought it was stomach flu. Cramping steady then increases when needing to have a BM. Stomach pain improves after BM. No blood or melena. Loose, watery, some fecal material. Feels he went about 20 times on Sunday then today maybe only 5-6 loose bowel movements.     Allergies  Allergen Reactions  . Bee Venom Anaphylaxis  . Penicillins Other (See Comments)    Reaction:  Unknown      Current Outpatient Prescriptions:  .  ALPRAZolam (XANAX) 0.5 MG tablet, Take 0.5-1 tablets (0.25-0.5 mg total) by mouth 2 (two) times daily as needed for anxiety., Disp: 60 tablet, Rfl: 4 .  buPROPion (WELLBUTRIN SR) 150 MG 12 hr tablet, Take 1 tablet (150 mg total) by mouth 2 (two) times daily., Disp: 180 tablet, Rfl: 1 .  oxyCODONE (OXY IR/ROXICODONE) 5 MG immediate release tablet, Take 1-2 tablets (5-10 mg total) by mouth every 6 (six) hours as needed for breakthrough pain., Disp: 60 tablet, Rfl: 0 .  pantoprazole (PROTONIX) 40 MG tablet, Take 1 tablet (40 mg total) by mouth daily., Disp: 90 tablet, Rfl: 1  Review of Systems  Constitutional:  Positive for appetite change and fatigue. Negative for diaphoresis and fever.  Respiratory: Negative.   Cardiovascular: Negative.   Gastrointestinal: Positive for abdominal distention, abdominal pain, diarrhea, nausea and vomiting. Negative for anal bleeding, blood in stool, constipation and rectal pain.  Genitourinary: Negative.   Musculoskeletal: Negative for back pain.  Neurological: Positive for headaches. Negative for dizziness.    Social History  Substance Use Topics  . Smoking status: Never Smoker  . Smokeless tobacco: Never Used  . Alcohol use 4.8 oz/week    8 Glasses of wine per week   Objective:   BP 118/88 (BP Location: Right Arm, Patient Position: Sitting, Cuff Size: Large)   Pulse 84   Temp 98 F (36.7 C) (Oral)   Resp 16   Wt (!) 303 lb (137.4 kg)   SpO2 98%   BMI 41.09 kg/m   Physical Exam  Constitutional: He is oriented to person, place, and time. He appears well-developed and well-nourished. No distress.  Cardiovascular: Normal rate, regular rhythm and normal heart sounds.  Exam reveals no gallop and no friction rub.   No murmur heard. Pulmonary/Chest: Effort normal and breath sounds normal. No respiratory distress. He has no wheezes. He has no rales.  Abdominal: Soft. Normal appearance and bowel sounds are normal. He exhibits no distension and no mass. There is no hepatosplenomegaly. There is generalized tenderness. There is no rebound, no guarding and no CVA tenderness.  Neurological: He is alert and oriented to person, place, and time.  Skin: Skin is warm and dry. He is not diaphoretic.  Vitals reviewed.     Assessment & Plan:     1. Diarrhea, unspecified type I will check labs as below to see if he has a shift in his differential that indicates possible bacterial infection and checking electrolytes due to frequency of bowel movements. I have also given him a lab slip to collect stool sample to check for C. difficile and cultures. I have given him  metronidazole prescription for him to have on hand if symptoms worsen or if any of the above testing comes back indicating bacterial infection. He is to call the office if symptoms fail to improve or worsen. - CBC with Differential - Comprehensive Metabolic Panel (CMET) - Stool Culture - C difficile Toxins A+B W/Rflx - metroNIDAZOLE (FLAGYL) 500 MG tablet; Take 1 tablet (500 mg total) by mouth 2 (two) times daily.  Dispense: 20 tablet; Refill: 0       Mar Daring, PA-C  [facility]

## 2016-05-15 ENCOUNTER — Telehealth: Payer: Self-pay

## 2016-05-15 ENCOUNTER — Other Ambulatory Visit: Payer: Self-pay | Admitting: Physician Assistant

## 2016-05-15 LAB — CBC WITH DIFFERENTIAL/PLATELET
Basophils Absolute: 0 10*3/uL (ref 0.0–0.2)
Basos: 0 %
EOS (ABSOLUTE): 0.1 10*3/uL (ref 0.0–0.4)
Eos: 1 %
Hematocrit: 47.3 % (ref 37.5–51.0)
Hemoglobin: 16.5 g/dL (ref 12.6–17.7)
Immature Grans (Abs): 0 10*3/uL (ref 0.0–0.1)
Immature Granulocytes: 0 %
Lymphocytes Absolute: 2.6 10*3/uL (ref 0.7–3.1)
Lymphs: 32 %
MCH: 32 pg (ref 26.6–33.0)
MCHC: 34.9 g/dL (ref 31.5–35.7)
MCV: 92 fL (ref 79–97)
Monocytes Absolute: 1.5 10*3/uL — ABNORMAL HIGH (ref 0.1–0.9)
Monocytes: 19 %
Neutrophils Absolute: 3.7 10*3/uL (ref 1.4–7.0)
Neutrophils: 48 %
Platelets: 189 10*3/uL (ref 150–379)
RBC: 5.16 x10E6/uL (ref 4.14–5.80)
RDW: 12.7 % (ref 12.3–15.4)
WBC: 8 10*3/uL (ref 3.4–10.8)

## 2016-05-15 LAB — COMPREHENSIVE METABOLIC PANEL
ALT: 105 IU/L — ABNORMAL HIGH (ref 0–44)
AST: 58 IU/L — ABNORMAL HIGH (ref 0–40)
Albumin/Globulin Ratio: 1.6 (ref 1.2–2.2)
Albumin: 4.7 g/dL (ref 3.5–5.5)
Alkaline Phosphatase: 59 IU/L (ref 39–117)
BUN/Creatinine Ratio: 12 (ref 9–20)
BUN: 15 mg/dL (ref 6–24)
Bilirubin Total: 0.6 mg/dL (ref 0.0–1.2)
CO2: 21 mmol/L (ref 18–29)
Calcium: 9.2 mg/dL (ref 8.7–10.2)
Chloride: 96 mmol/L (ref 96–106)
Creatinine, Ser: 1.25 mg/dL (ref 0.76–1.27)
GFR calc Af Amer: 79 mL/min/{1.73_m2} (ref >59)
GFR calc non Af Amer: 69 mL/min/{1.73_m2} (ref >59)
Globulin, Total: 3 g/dL (ref 1.5–4.5)
Glucose: 94 mg/dL (ref 65–99)
Potassium: 3.8 mmol/L (ref 3.5–5.2)
Sodium: 138 mmol/L (ref 134–144)
Total Protein: 7.7 g/dL (ref 6.0–8.5)

## 2016-05-15 NOTE — Telephone Encounter (Signed)
Pt needs refill on the Bupropion 150 mg BID .  Please fill.  Thanks Con Memos

## 2016-05-15 NOTE — Telephone Encounter (Signed)
-----   Message from Mar Daring, PA-C sent at 05/15/2016  8:14 AM EST ----- No elevated WBC count or shift. Electrolytes are normal. Liver enzymes are slightly up from last month. Once the diarrhea subsides if wanted we can work up these labs with getting a liver US.

## 2016-05-15 NOTE — Telephone Encounter (Signed)
Patient has been advised. KW 

## 2016-05-15 NOTE — Telephone Encounter (Signed)
Medication was filled on 04/09/16 by Fenton Malling  For qty of 180 tablets with 1 refill. Is there a pre-authorization pending on patient? KW

## 2016-05-16 ENCOUNTER — Encounter: Payer: Self-pay | Admitting: Physician Assistant

## 2016-05-16 NOTE — Telephone Encounter (Signed)
Patient states that he does not need refill it was a error in regards to home delivery. KW

## 2016-05-22 ENCOUNTER — Telehealth: Payer: Self-pay

## 2016-05-22 LAB — STOOL CULTURE: E coli, Shiga toxin Assay: NEGATIVE

## 2016-05-22 LAB — C DIFFICILE, CYTOTOXIN B

## 2016-05-22 LAB — C DIFFICILE TOXINS A+B W/RFLX: C difficile Toxins A+B, EIA: NEGATIVE

## 2016-05-22 NOTE — Telephone Encounter (Signed)
-----   Message from Mar Daring, PA-C sent at 05/22/2016 10:41 AM EST ----- Stool culture is positive for salmonella. Normally self-limited. Most often treat with hydration and immodium or other anti-motility agents. It has been sent for further testing and confirmation. Continue good hand hygiene.

## 2016-05-22 NOTE — Telephone Encounter (Signed)
Patient has been advised. KW 

## 2016-05-29 ENCOUNTER — Other Ambulatory Visit: Payer: Self-pay | Admitting: Physician Assistant

## 2016-05-29 DIAGNOSIS — M25532 Pain in left wrist: Secondary | ICD-10-CM

## 2016-05-30 MED ORDER — OXYCODONE HCL 5 MG PO TABS: 5.0000 mg | ORAL_TABLET | Freq: Four times a day (QID) | ORAL | 0 refills | Status: DC | PRN

## 2016-05-30 NOTE — Telephone Encounter (Signed)
Please review-aa 

## 2016-05-30 NOTE — Telephone Encounter (Signed)
Please notify patient Rx available for pick up

## 2016-05-30 NOTE — Telephone Encounter (Signed)
Patient was advised that Rx is ready up front for pick up and he reports he is better from diarrhea.  Thanks,  -Jamaira Sherk

## 2016-06-23 LAB — STATE LABORATORY REPORT

## 2016-06-25 ENCOUNTER — Other Ambulatory Visit: Payer: Self-pay | Admitting: Physician Assistant

## 2016-06-25 DIAGNOSIS — M25532 Pain in left wrist: Secondary | ICD-10-CM

## 2016-06-25 MED ORDER — OXYCODONE HCL 5 MG PO TABS: 5.0000 mg | ORAL_TABLET | Freq: Four times a day (QID) | ORAL | 0 refills | Status: DC | PRN

## 2016-07-13 ENCOUNTER — Other Ambulatory Visit: Payer: Self-pay | Admitting: Physician Assistant

## 2016-07-13 DIAGNOSIS — F411 Generalized anxiety disorder: Secondary | ICD-10-CM

## 2016-08-02 ENCOUNTER — Other Ambulatory Visit: Payer: Self-pay | Admitting: Physician Assistant

## 2016-08-02 DIAGNOSIS — M25532 Pain in left wrist: Secondary | ICD-10-CM

## 2016-08-08 ENCOUNTER — Telehealth: Payer: Self-pay

## 2016-08-08 MED ORDER — OXYCODONE HCL 5 MG PO TABS: 5.0000 mg | ORAL_TABLET | Freq: Four times a day (QID) | ORAL | 0 refills | Status: DC | PRN

## 2016-08-08 NOTE — Telephone Encounter (Signed)
Left patient a voicemail advising him that RX is ready for pick up.

## 2016-08-08 NOTE — Telephone Encounter (Signed)
LM that Prescription is placed up front ready for pick-up.  Thanks,  -Noriko Macari

## 2016-08-08 NOTE — Telephone Encounter (Signed)
Please notify patient Rx ready for pick up.

## 2016-09-03 ENCOUNTER — Telehealth: Payer: Self-pay

## 2016-09-03 ENCOUNTER — Telehealth: Payer: Self-pay | Admitting: Physician Assistant

## 2016-09-03 ENCOUNTER — Encounter: Payer: Self-pay | Admitting: Physician Assistant

## 2016-09-03 DIAGNOSIS — M25532 Pain in left wrist: Secondary | ICD-10-CM

## 2016-09-03 MED ORDER — OXYCODONE HCL 5 MG PO TABS: 5.0000 mg | ORAL_TABLET | Freq: Four times a day (QID) | ORAL | 0 refills | Status: DC | PRN

## 2016-09-03 MED ORDER — OXYCODONE HCL 5 MG PO TABS: 5.0000 mg | ORAL_TABLET | Freq: Three times a day (TID) | ORAL | 0 refills | Status: DC | PRN

## 2016-09-03 NOTE — Telephone Encounter (Signed)
Oxycodone refilled for printed copy for mail in

## 2016-09-03 NOTE — Telephone Encounter (Signed)
Rx for one week printed and will send mail order to [facility]

## 2016-09-03 NOTE — Telephone Encounter (Signed)
[place] and prescription for Oxycodone can only be mail (hard copy) to the address below.  [address]. [place]

## 2016-09-03 NOTE — Telephone Encounter (Signed)
Pt requested local pharmacy for this time. Renaldo Fiddler, CMA

## 2016-09-03 NOTE — Telephone Encounter (Signed)
Yes there is one printed for one week amount. That is all that is allowed at [facility] (one week at a time). This has been printed.for him to pick up and then we will try to get a month supply of 60 from the mail in pharmacy.

## 2016-09-03 NOTE — Telephone Encounter (Signed)
Patient called and just wanted to explain what he was trying to say about Oxycodone. [facility] pharmacist called and states that he can only get 42 tablets every week and bring it to them. Will not except for 60 tablets. Patient states he does not need to use 42 tablets every week but advised patient I guess with the new law insurance covers that way now and that he can take 42 tablet RX in and then just call us when he needs another RX which will not be every week. He said he would like to get RX to take to [facility] for 42 tablets and then next time will call ahead and request to try 60 tablets to be mailed to [facility] order. He has 5 tablets left and due to how long it takes to get mail order medication he did not want to use mail order this route. Please call patient if you have questions about this. Thank you. Let me know if you have any questions for me-aa

## 2016-09-03 NOTE — Telephone Encounter (Signed)
Oxycodone refilled x 1 month

## 2016-09-13 ENCOUNTER — Telehealth: Payer: Self-pay

## 2016-09-13 NOTE — Telephone Encounter (Signed)
Called patient on his cell number, someone pick up but all I was hearing was a conversation between two mens. If patient calls back, I was calling him regarding his Roxicodone Tab medication. The insurances plan limits the quantity, the max is  A 7D/S every 90 days. Practically he can only get 42 for 90 days. Now if patient wants to continue this medication he needs to be refer to the pain clinic and they need to put a PA. But Tawanna Sat can also prescribed some anti inflammatory medication if patient wants to try. Like Meloxicam.   Thanks,  -Joseline

## 2016-09-21 NOTE — Telephone Encounter (Signed)
Patient reports that he pays out of pocket for his medications anyway. Offered him the Meloxicam and the patient stated that he will continue to use the oxycodone as needed.

## 2016-10-01 ENCOUNTER — Other Ambulatory Visit: Payer: Self-pay | Admitting: Physician Assistant

## 2016-10-01 DIAGNOSIS — M25532 Pain in left wrist: Secondary | ICD-10-CM

## 2016-10-02 MED ORDER — OXYCODONE HCL 5 MG PO TABS: 5.0000 mg | ORAL_TABLET | Freq: Three times a day (TID) | ORAL | 0 refills | Status: DC | PRN

## 2016-11-05 ENCOUNTER — Encounter: Payer: Self-pay | Admitting: Physician Assistant

## 2016-11-05 ENCOUNTER — Other Ambulatory Visit: Payer: Self-pay | Admitting: Physician Assistant

## 2016-11-05 DIAGNOSIS — M25532 Pain in left wrist: Secondary | ICD-10-CM

## 2016-11-05 DIAGNOSIS — F419 Anxiety disorder, unspecified: Secondary | ICD-10-CM

## 2016-11-05 MED ORDER — ALPRAZOLAM 0.5 MG PO TABS: 0.2500 mg | ORAL_TABLET | Freq: Two times a day (BID) | ORAL | 1 refills | Status: DC | PRN

## 2016-11-05 MED ORDER — OXYCODONE HCL 5 MG PO TABS: 5.0000 mg | ORAL_TABLET | Freq: Three times a day (TID) | ORAL | 0 refills | Status: DC | PRN

## 2016-11-05 NOTE — Telephone Encounter (Signed)
Oxy 36m IR refilled.  Substance registry checked and no flags noted.

## 2016-11-27 ENCOUNTER — Encounter: Payer: Self-pay | Admitting: Physician Assistant

## 2016-12-13 ENCOUNTER — Other Ambulatory Visit: Payer: Self-pay | Admitting: Physician Assistant

## 2016-12-13 DIAGNOSIS — M25532 Pain in left wrist: Secondary | ICD-10-CM

## 2016-12-14 MED ORDER — OXYCODONE HCL 5 MG PO TABS: 5.0000 mg | ORAL_TABLET | Freq: Three times a day (TID) | ORAL | 0 refills | Status: DC | PRN

## 2016-12-14 NOTE — Telephone Encounter (Signed)
Rx printed. NCSR checked and no red flags noted.

## 2016-12-17 ENCOUNTER — Emergency Department: Payer: 59

## 2016-12-17 ENCOUNTER — Emergency Department
Admission: EM | Admit: 2016-12-17 | Discharge: 2016-12-17 | Disposition: A | Discharge status: Home / Self Care | Payer: 59 | Attending: Student in an Organized Health Care Education/Training Program | Admitting: Student in an Organized Health Care Education/Training Program

## 2016-12-17 ENCOUNTER — Encounter: Payer: Self-pay | Admitting: Emergency Medicine

## 2016-12-17 DIAGNOSIS — Z79899 Other long term (current) drug therapy: Secondary | ICD-10-CM | POA: Insufficient documentation

## 2016-12-17 DIAGNOSIS — K760 Fatty (change of) liver, not elsewhere classified: Secondary | ICD-10-CM

## 2016-12-17 DIAGNOSIS — R1013 Epigastric pain: Secondary | ICD-10-CM | POA: Diagnosis present

## 2016-12-17 DIAGNOSIS — R945 Abnormal results of liver function studies: Secondary | ICD-10-CM | POA: Insufficient documentation

## 2016-12-17 DIAGNOSIS — R748 Abnormal levels of other serum enzymes: Secondary | ICD-10-CM

## 2016-12-17 LAB — CBC
HCT: 46.1 % (ref 40.0–52.0)
Hemoglobin: 16.1 g/dL (ref 13.0–18.0)
MCH: 32.6 pg (ref 26.0–34.0)
MCHC: 34.9 g/dL (ref 32.0–36.0)
MCV: 93.4 fL (ref 80.0–100.0)
Platelets: 187 10*3/uL (ref 150–440)
RBC: 4.93 MIL/uL (ref 4.40–5.90)
RDW: 13.1 % (ref 11.5–14.5)
WBC: 7.3 10*3/uL (ref 3.8–10.6)

## 2016-12-17 LAB — TROPONIN I
Troponin I: <0.03 ng/mL (ref <0.03)
Troponin I: <0.03 ng/mL (ref <0.03)

## 2016-12-17 LAB — BASIC METABOLIC PANEL
Anion gap: 8 (ref 5–15)
BUN: 17 mg/dL (ref 6–20)
CO2: 26 mmol/L (ref 22–32)
Calcium: 9.2 mg/dL (ref 8.9–10.3)
Chloride: 104 mmol/L (ref 101–111)
Creatinine, Ser: 0.99 mg/dL (ref 0.61–1.24)
GFR calc Af Amer: >60 mL/min (ref >60)
GFR calc non Af Amer: >60 mL/min (ref >60)
Glucose, Bld: 104 mg/dL — ABNORMAL HIGH (ref 65–99)
Potassium: 4.3 mmol/L (ref 3.5–5.1)
Sodium: 138 mmol/L (ref 135–145)

## 2016-12-17 LAB — HEPATIC FUNCTION PANEL
ALT: 276 U/L — ABNORMAL HIGH (ref 17–63)
AST: 239 U/L — ABNORMAL HIGH (ref 15–41)
Albumin: 3.9 g/dL (ref 3.5–5.0)
Alkaline Phosphatase: 69 U/L (ref 38–126)
Bilirubin, Direct: 0.2 mg/dL (ref 0.1–0.5)
Indirect Bilirubin: 0.7 mg/dL (ref 0.3–0.9)
Total Bilirubin: 0.9 mg/dL (ref 0.3–1.2)
Total Protein: 7.1 g/dL (ref 6.5–8.1)

## 2016-12-17 LAB — LIPASE, BLOOD: Lipase: 28 U/L (ref 11–51)

## 2016-12-17 MED ORDER — IOPAMIDOL (ISOVUE-300) INJECTION 61%
100.0000 mL | Freq: Once | INTRAVENOUS | Status: AC | PRN
  Administered 2016-12-17: 100 mL via INTRAVENOUS

## 2016-12-17 MED ORDER — PROMETHAZINE HCL 25 MG/ML IJ SOLN
12.5000 mg | Freq: Once | INTRAMUSCULAR | Status: AC
  Administered 2016-12-17: 12.5 mg via INTRAVENOUS
  Filled 2016-12-17: qty 1

## 2016-12-17 MED ORDER — ONDANSETRON HCL 4 MG PO TABS: 4.0000 mg | ORAL_TABLET | Freq: Every day | ORAL | 0 refills | Status: AC | PRN

## 2016-12-17 NOTE — Discharge Instructions (Signed)

## 2016-12-17 NOTE — ED Notes (Signed)
Patient transported to X-ray 

## 2016-12-17 NOTE — ED Notes (Signed)
Pt given saltine crackers, peanut butter, and ginger ale

## 2016-12-17 NOTE — ED Provider Notes (Signed)
[facility] Emergency Department Provider Note    First MD Initiated Contact with Patient 12/17/16 0801     (approximate)  I have reviewed the triage vital signs and the nursing notes.   HISTORY  Chief Complaint Chest Pain    HPI Francisco Oneal is a 46 y.o. male with a personal history of heartburn presents with 3 days of epigastric discomfort and tightness associated with nausea. Patient states that yesterday was mowing the yard and fell to getting worse. Was unable to sleep last night as he cannot get comfortable. He points to the epigastric region when asked where it is hurting the most. He denies any chest pain or shortness of breath. Feels like he needs to throw up but has been unable to. Has not passed gas today.    No past medical history on file. Family History  Problem Relation Age of Onset  . Emphysema Mother   . Cancer Mother    Past Surgical History:  Procedure Laterality Date  . APPENDECTOMY    . INCISION AND DRAINAGE OF WOUND Left 12/08/2014   Procedure: IRRIGATION AND DEBRIDEMENT WOUND;  Surgeon: Corky Mull, MD;  Location: [facility] ORS;  Service: Orthopedics;  Laterality: Left;  . OPEN REDUCTION INTERNAL FIXATION (ORIF) DISTAL RADIAL FRACTURE Left 12/08/2014   Procedure: OPEN REDUCTION INTERNAL FIXATION (ORIF) DISTAL RADIAL FRACTURE;  Surgeon: Corky Mull, MD;  Location: [facility] ORS;  Service: Orthopedics;  Laterality: Left;   Patient Active Problem List   Diagnosis Date Noted  . GERD (gastroesophageal reflux disease) 12/07/2015  . Allergic rhinitis 12/07/2015  . Lateral epicondylitis 12/07/2015  . GAD (generalized anxiety disorder) 12/07/2015  . Left wrist pain 12/07/2015  . Laceration of elbow with complication 95/28/4132      Prior to Admission medications   Medication Sig Start Date End Date Taking? Authorizing Provider  ALPRAZolam Duanne Moron) 0.5 MG tablet Take 0.5-1 tablets (0.25-0.5 mg total) by mouth 2 (two) times daily as needed for  anxiety. 11/05/16   Mar Daring, PA-C  buPROPion [facility] SR) 150 MG 12 hr tablet TAKE 1 TABLET TWICE A DAY 07/13/16   Fenton Malling M, PA-C  metroNIDAZOLE (FLAGYL) 500 MG tablet Take 1 tablet (500 mg total) by mouth 2 (two) times daily. 05/14/16   Mar Daring, PA-C  oxyCODONE (OXY IR/ROXICODONE) 5 MG immediate release tablet Take 1 tablet (5 mg total) by mouth every 8 (eight) hours as needed for severe pain. 12/14/16   Mar Daring, PA-C  pantoprazole (PROTONIX) 40 MG tablet Take 1 tablet (40 mg total) by mouth daily. 04/09/16   Mar Daring, PA-C    Allergies Bee venom and Penicillins    Social History Social History  Substance Use Topics  . Smoking status: Never Smoker  . Smokeless tobacco: Never Used  . Alcohol use 4.8 oz/week    8 Glasses of wine per week    Review of Systems Patient denies headaches, rhinorrhea, blurry vision, numbness, shortness of breath, chest pain, edema, cough, abdominal pain, nausea, vomiting, diarrhea, dysuria, fevers, rashes or hallucinations unless otherwise stated above in HPI. ____________________________________________   PHYSICAL EXAM:  VITAL SIGNS: Vitals:   12/17/16 0757  BP: 119/64  Pulse: 75  Resp: 18  Temp: 97.7 F (36.5 C)    Constitutional: Alert and oriented. Well appearing and in no acute distress. Eyes: Conjunctivae are normal.  Head: Atraumatic. Nose: No congestion/rhinnorhea. Mouth/Throat: Mucous membranes are moist.   Neck: No stridor. Painless ROM.  Cardiovascular: Normal  rate, regular rhythm. Grossly normal heart sounds.  Good peripheral circulation. Respiratory: Normal respiratory effort.  No retractions. Lungs CTAB. Gastrointestinal: Soft but with mild ttp of epigastric. No distention. No abdominal bruits. No CVA tenderness. Musculoskeletal: No lower extremity tenderness nor edema.  No joint effusions. Neurologic:  Normal speech and language. No gross focal neurologic deficits are  appreciated. No facial droop Skin:  Skin is warm, dry and intact. No rash noted. Psychiatric: Mood and affect are normal. Speech and behavior are normal.  ____________________________________________   LABS (all labs ordered are listed, but only abnormal results are displayed)  Results for orders placed or performed during the hospital encounter of 12/17/16 (from the past 24 hour(s))  Basic metabolic panel     Status: Abnormal   Collection Time: 12/17/16  8:25 AM  Result Value Ref Range   Sodium 138 135 - 145 mmol/L   Potassium 4.3 3.5 - 5.1 mmol/L   Chloride 104 101 - 111 mmol/L   CO2 26 22 - 32 mmol/L   Glucose, Bld 104 (H) 65 - 99 mg/dL   BUN 17 6 - 20 mg/dL   Creatinine, Ser 0.99 0.61 - 1.24 mg/dL   Calcium 9.2 8.9 - 10.3 mg/dL   GFR calc non Af Amer >60 >60 mL/min   GFR calc Af Amer >60 >60 mL/min   Anion gap 8 5 - 15  CBC     Status: None   Collection Time: 12/17/16  8:25 AM  Result Value Ref Range   WBC 7.3 3.8 - 10.6 K/uL   RBC 4.93 4.40 - 5.90 MIL/uL   Hemoglobin 16.1 13.0 - 18.0 g/dL   HCT 46.1 40.0 - 52.0 %   MCV 93.4 80.0 - 100.0 fL   MCH 32.6 26.0 - 34.0 pg   MCHC 34.9 32.0 - 36.0 g/dL   RDW 13.1 11.5 - 14.5 %   Platelets 187 150 - 440 K/uL  Troponin I     Status: None   Collection Time: 12/17/16  8:25 AM  Result Value Ref Range   Troponin I <0.03 <0.03 ng/mL   ____________________________________________  EKG My review and personal interpretation at Time: 7:51   Indication: chest pain  Rate: 80  Rhythm: sinus Axis: normal Other: normal intervals, no STEMI, no depressions ____________________________________________  RADIOLOGY  I personally reviewed all radiographic images ordered to evaluate for the above acute complaints and reviewed radiology reports and findings.  These findings were personally discussed with the patient.  Please see medical record for radiology report.  ____________________________________________   PROCEDURES  Procedure(s)  performed:  Procedures    Critical Care performed: no ____________________________________________   INITIAL IMPRESSION / ASSESSMENT AND PLAN / ED COURSE  Pertinent labs & imaging results that were available during my care of the patient were reviewed by me and considered in my medical decision making (see chart for details).  DDX: pancreatits, cholecystits, cholelithiasis, colitis, gastritis,   Taeden Geller is a 46 y.o. who presents to the ED with epigastric pain as described above. Blood work and radiographic testing ordered for the above differential. Patient afebrile and otherwise hemodynamically stable. EKG and troponin are negative for any ischemia. Clinically does not seem consistent with ACS.  Chest x-ray shows no infiltrate. CT imaging ordered to evaluate for epigastric pain shows evidence of a mildly enlarged liver with probable fatty infiltration. His blood work does show mildly elevated LFTs but has a normal bilirubin and alkaline phosphatase. No evidence of pancreatitis. Patient was able to tolerate  oral hydration. At this point I do feel he is stable for follow-up as an outpatient.  Have discussed with the patient and available family all diagnostics and treatments performed thus far and all questions were answered to the best of my ability. The patient demonstrates understanding and agreement with plan.       ____________________________________________   FINAL CLINICAL IMPRESSION(S) / ED DIAGNOSES  Final diagnoses:  Acute epigastric pain  Elevated liver enzymes  Fatty liver      NEW MEDICATIONS STARTED DURING THIS VISIT:  New Prescriptions   No medications on file     Note:  This document was prepared using Dragon voice recognition software and may include unintentional dictation errors.    Merlyn Lot, MD 12/17/16 [phone]

## 2016-12-17 NOTE — ED Notes (Signed)
Pt was able to eat and drink with minimal difficulty after taking the phenergan

## 2016-12-17 NOTE — ED Triage Notes (Signed)
Began chest pain while mowing yesterday, increasing.

## 2017-01-17 ENCOUNTER — Other Ambulatory Visit: Payer: Self-pay | Admitting: Physician Assistant

## 2017-01-17 DIAGNOSIS — M25532 Pain in left wrist: Secondary | ICD-10-CM

## 2017-01-18 MED ORDER — OXYCODONE HCL 5 MG PO TABS: 5.0000 mg | ORAL_TABLET | Freq: Three times a day (TID) | ORAL | 0 refills | Status: DC | PRN

## 2017-01-18 NOTE — Telephone Encounter (Signed)
Rx Printed. NCCSR reviewed and no red flags noted.

## 2017-02-24 ENCOUNTER — Other Ambulatory Visit: Payer: Self-pay | Admitting: Physician Assistant

## 2017-02-24 DIAGNOSIS — M25532 Pain in left wrist: Secondary | ICD-10-CM

## 2017-02-25 MED ORDER — OXYCODONE HCL 5 MG PO TABS: 5.0000 mg | ORAL_TABLET | Freq: Three times a day (TID) | ORAL | 0 refills | Status: DC | PRN

## 2017-02-25 NOTE — Telephone Encounter (Signed)
[place] reviewed. Rx printed.

## 2017-02-25 NOTE — Telephone Encounter (Signed)
Left patient a message advising him that RX is ready for pick up.

## 2017-03-13 ENCOUNTER — Other Ambulatory Visit: Payer: Self-pay | Admitting: Physician Assistant

## 2017-03-13 DIAGNOSIS — F411 Generalized anxiety disorder: Secondary | ICD-10-CM

## 2017-04-05 ENCOUNTER — Other Ambulatory Visit: Payer: Self-pay | Admitting: Physician Assistant

## 2017-04-05 DIAGNOSIS — K219 Gastro-esophageal reflux disease without esophagitis: Secondary | ICD-10-CM

## 2017-04-08 ENCOUNTER — Other Ambulatory Visit: Payer: Self-pay | Admitting: Physician Assistant

## 2017-04-08 DIAGNOSIS — M25532 Pain in left wrist: Secondary | ICD-10-CM

## 2017-04-08 MED ORDER — OXYCODONE HCL 5 MG PO TABS: 5.0000 mg | ORAL_TABLET | Freq: Three times a day (TID) | ORAL | 0 refills | Status: DC | PRN

## 2017-04-08 NOTE — Telephone Encounter (Signed)
Patient advised the prescription is placed up front ready for pick up.  Thanks,  -Falon Huesca

## 2017-04-08 NOTE — Telephone Encounter (Signed)
[place] reviewed. Rx printed.

## 2017-05-05 ENCOUNTER — Encounter: Payer: Self-pay | Admitting: Physician Assistant

## 2017-05-05 ENCOUNTER — Other Ambulatory Visit: Payer: Self-pay | Admitting: Physician Assistant

## 2017-05-05 DIAGNOSIS — F419 Anxiety disorder, unspecified: Secondary | ICD-10-CM

## 2017-05-05 DIAGNOSIS — M25532 Pain in left wrist: Secondary | ICD-10-CM

## 2017-05-06 ENCOUNTER — Telehealth: Payer: Self-pay

## 2017-05-06 MED ORDER — OXYCODONE HCL 5 MG PO TABS: 5.0000 mg | ORAL_TABLET | Freq: Three times a day (TID) | ORAL | 0 refills | Status: DC | PRN

## 2017-05-06 MED ORDER — ALPRAZOLAM 0.5 MG PO TABS: 0.2500 mg | ORAL_TABLET | Freq: Two times a day (BID) | ORAL | 1 refills | Status: DC | PRN

## 2017-05-06 NOTE — Telephone Encounter (Signed)
[place] reviewed. Rx printed.

## 2017-05-06 NOTE — Telephone Encounter (Signed)
Prescription for Xanax called in to [facility]. Spoke with [facility] Pharmacist.  Thanks,  -Bobetta Korf

## 2017-06-05 ENCOUNTER — Other Ambulatory Visit: Payer: Self-pay | Admitting: Physician Assistant

## 2017-06-05 DIAGNOSIS — M25532 Pain in left wrist: Secondary | ICD-10-CM

## 2017-06-06 MED ORDER — OXYCODONE HCL 5 MG PO TABS: 5.0000 mg | ORAL_TABLET | Freq: Three times a day (TID) | ORAL | 0 refills | Status: DC | PRN

## 2017-06-06 NOTE — Telephone Encounter (Signed)
[place] reviewed and Rx printed.

## 2017-07-03 ENCOUNTER — Other Ambulatory Visit: Payer: Self-pay | Admitting: Physician Assistant

## 2017-07-03 DIAGNOSIS — M25532 Pain in left wrist: Secondary | ICD-10-CM

## 2017-07-03 MED ORDER — OXYCODONE HCL 5 MG PO TABS: 5.0000 mg | ORAL_TABLET | Freq: Three times a day (TID) | ORAL | 0 refills | Status: DC | PRN

## 2017-07-03 NOTE — Telephone Encounter (Signed)
[place] reviewed. Rx printed

## 2017-07-03 NOTE — Telephone Encounter (Signed)
Patient's wife advised

## 2017-08-05 ENCOUNTER — Other Ambulatory Visit: Payer: Self-pay | Admitting: Physician Assistant

## 2017-08-05 DIAGNOSIS — M25532 Pain in left wrist: Secondary | ICD-10-CM

## 2017-08-06 ENCOUNTER — Other Ambulatory Visit: Payer: Self-pay | Admitting: Physician Assistant

## 2017-08-06 DIAGNOSIS — M25532 Pain in left wrist: Secondary | ICD-10-CM

## 2017-08-06 MED ORDER — OXYCODONE HCL 5 MG PO TABS: 5.0000 mg | ORAL_TABLET | Freq: Three times a day (TID) | ORAL | 0 refills | Status: DC | PRN

## 2017-08-06 NOTE — Telephone Encounter (Signed)
[place] reviewed. Rx printed.

## 2017-08-06 NOTE — Progress Notes (Signed)
Had to reprint oxycodone Rx due to printing on wrong paper.

## 2017-08-25 IMAGING — CR DG CHEST 2V
1 series · 2 of 2 positions shown · non-contrast
Comparison: None in PACs

CLINICAL DATA: Onset of mid chest pain last evening around 10 p.m.
It is radiating to the right side of the chest this morning
associated with nausea.

EXAM:
CHEST  2 VIEW

[Series 1: dg chest 2 view · 0.14mm/px · 2 of 2 slices shown]
[im 1/2]
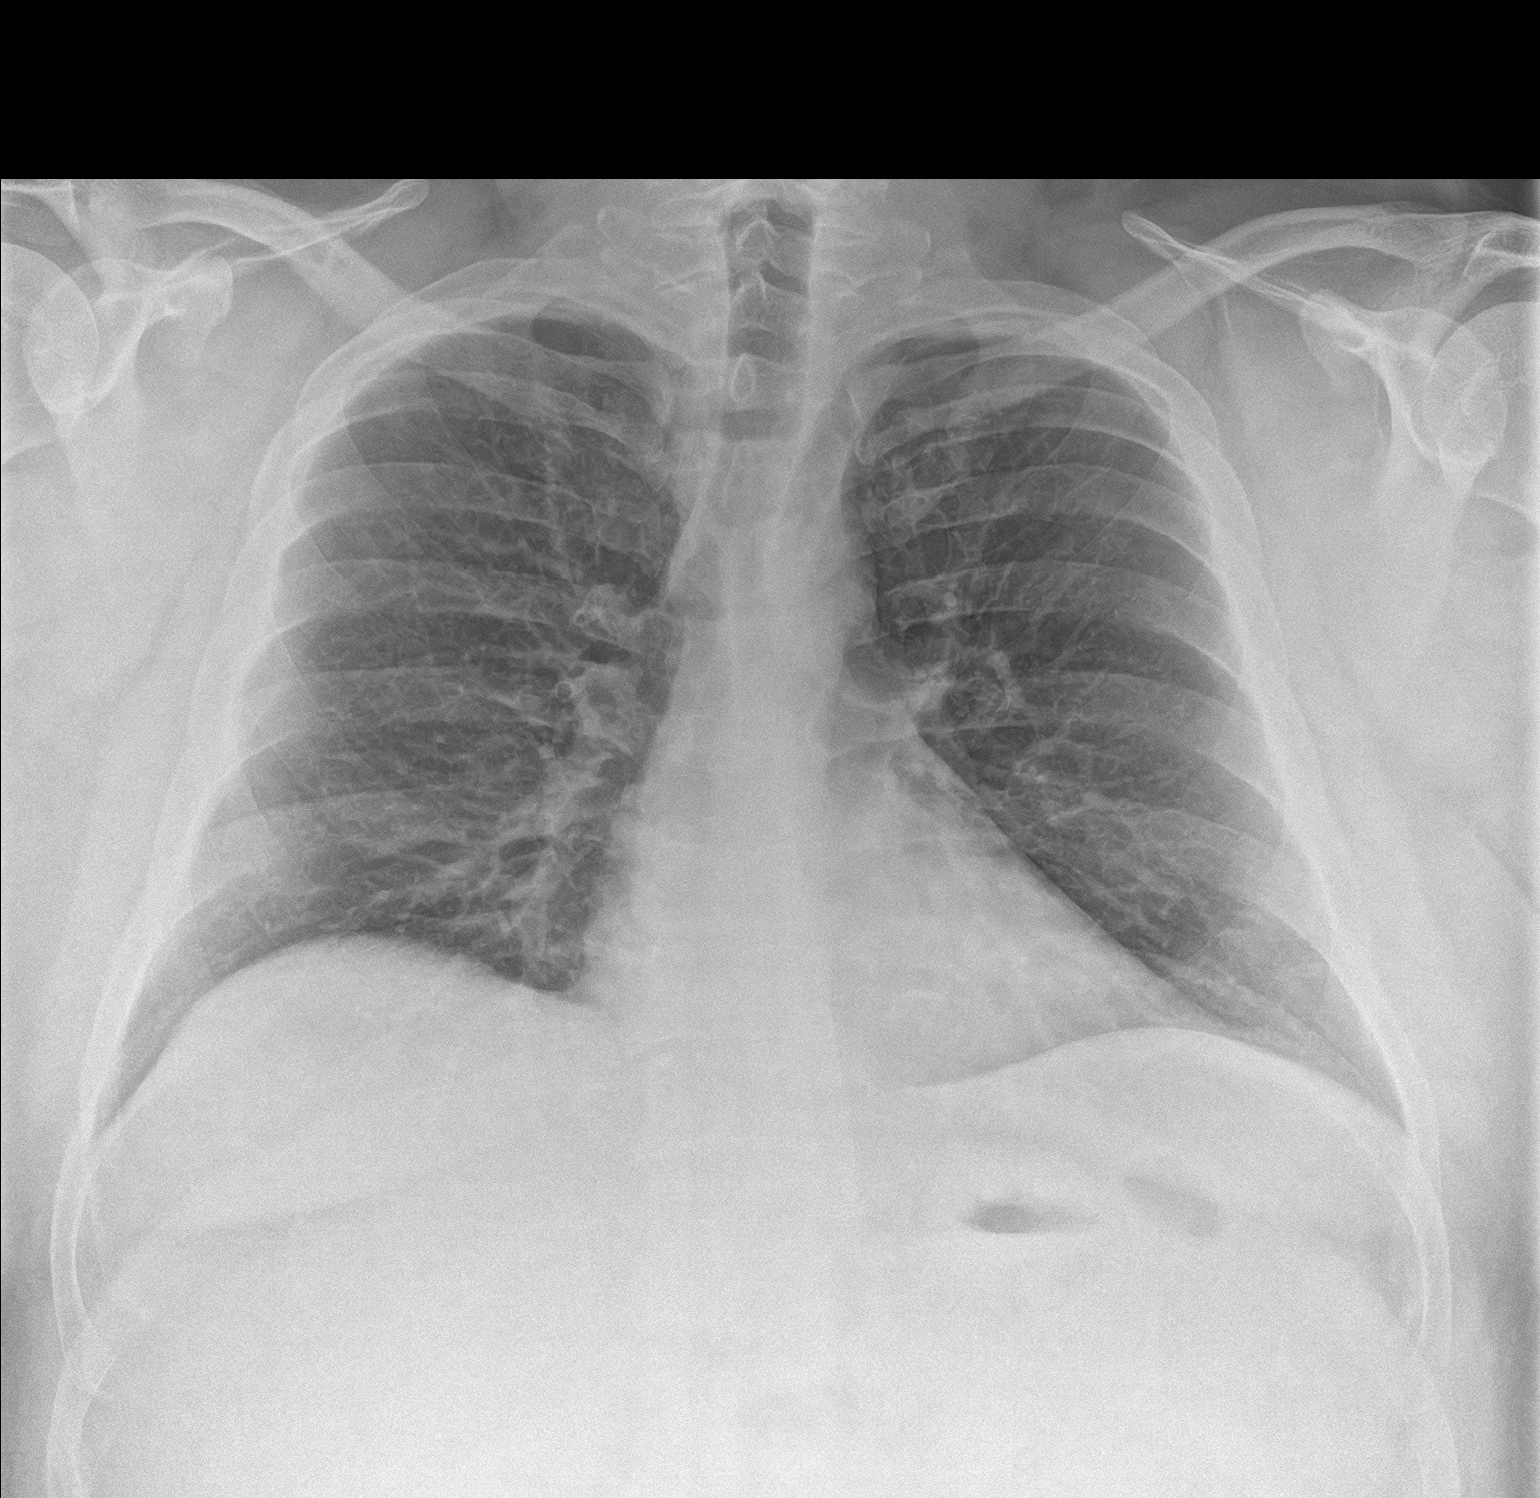
[im 2/2]
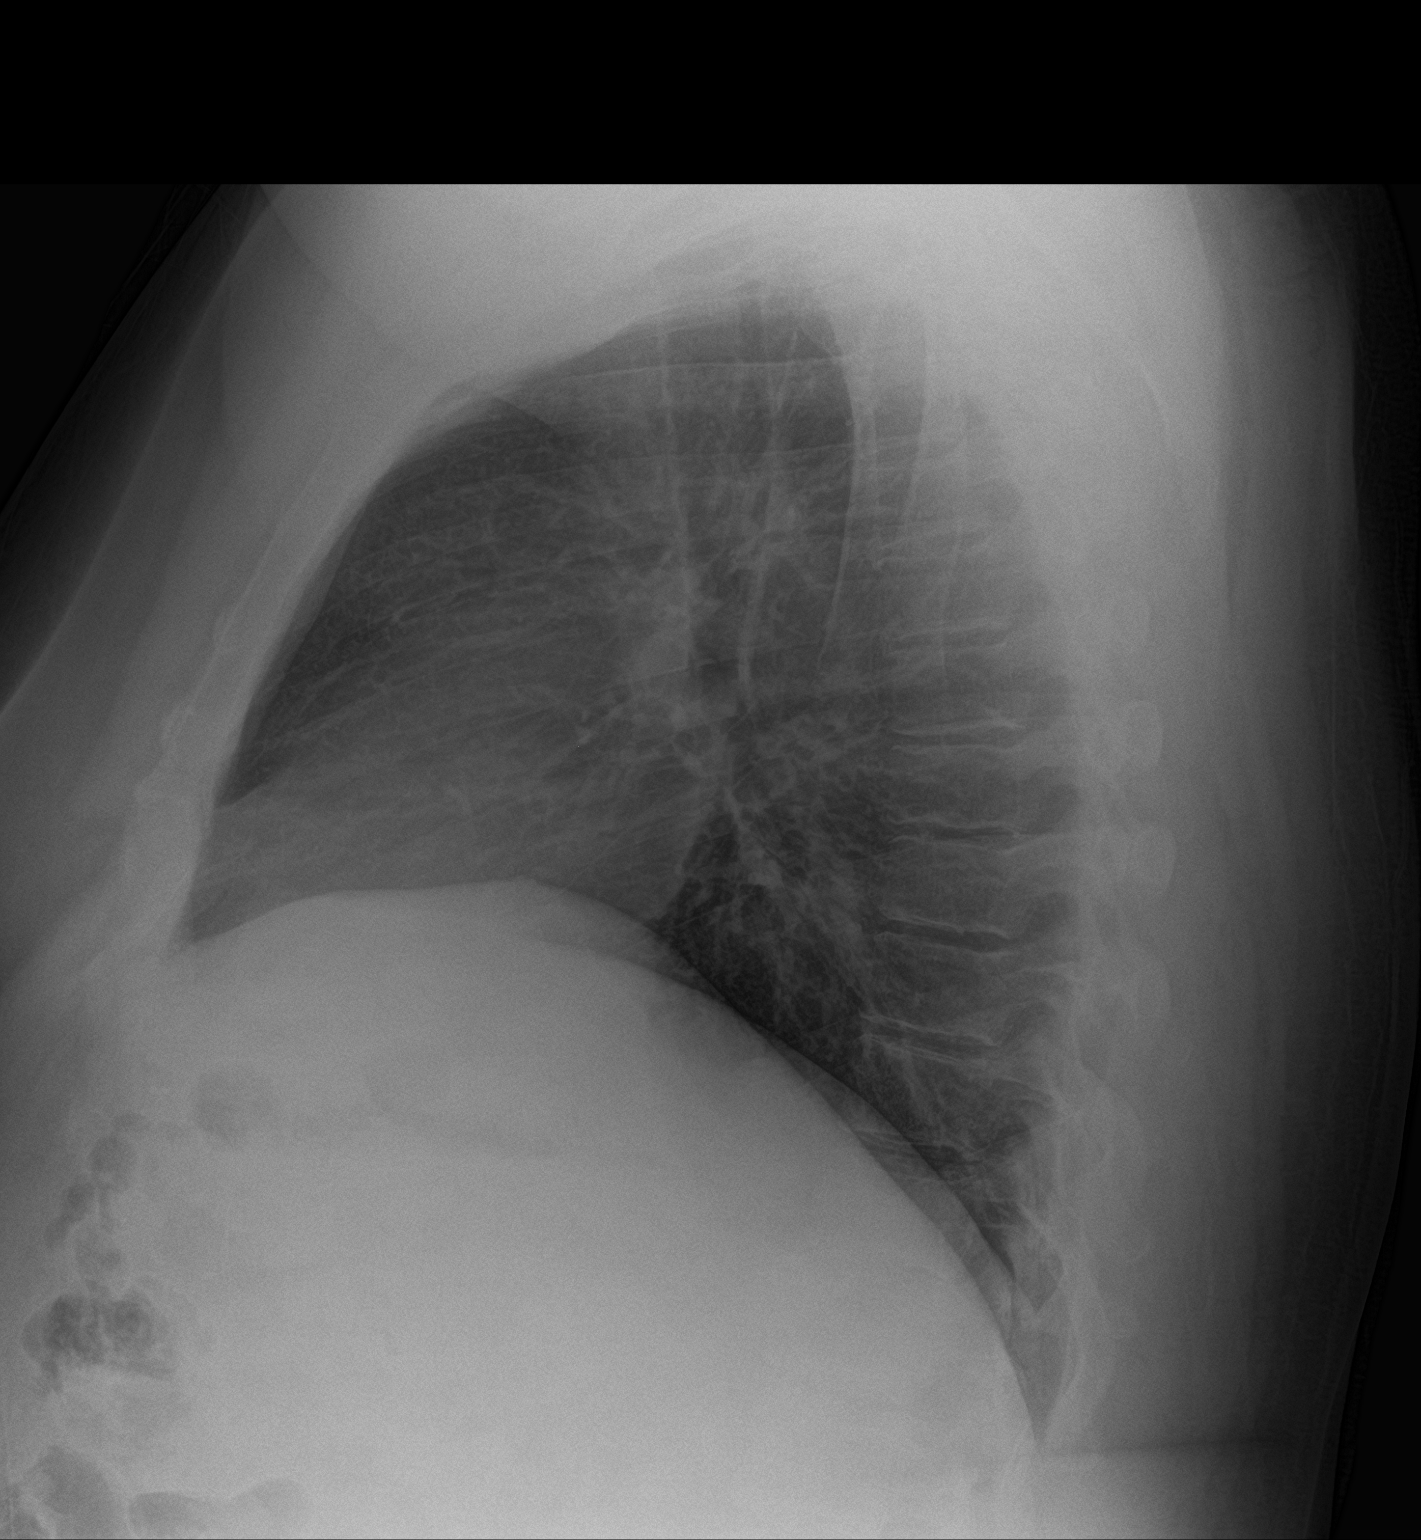

[2 of 2 positions shown; findings below may reference images not displayed]

FINDINGS: The lungs are mildly hypoinflated which accentuates the interstitial
markings. There is no alveolar infiltrate, pleural effusion, or
pneumothorax. The heart and pulmonary vascularity are normal. The
mediastinum is normal in width. The observed bony thorax is
unremarkable.
IMPRESSION: There is no CHF, pneumonia, nor other acute cardiopulmonary
abnormality.

## 2017-08-26 ENCOUNTER — Other Ambulatory Visit: Payer: Self-pay | Admitting: Physician Assistant

## 2017-08-26 DIAGNOSIS — F419 Anxiety disorder, unspecified: Secondary | ICD-10-CM

## 2017-08-26 MED ORDER — ALPRAZOLAM 0.5 MG PO TABS: 0.2500 mg | ORAL_TABLET | Freq: Two times a day (BID) | ORAL | 1 refills | Status: DC | PRN

## 2017-08-26 NOTE — Telephone Encounter (Signed)
[employer] faxed a refill request for the following medication. Thanks CC  ALPRAZolam (XANAX) 0.5 MG tablet

## 2017-08-26 NOTE — Telephone Encounter (Signed)
Prescription for Alprazolam called in to [facility] home delivery pharmacy.  Thanks,  -Beckam Abdulaziz

## 2017-09-10 ENCOUNTER — Other Ambulatory Visit: Payer: Self-pay | Admitting: Physician Assistant

## 2017-09-10 DIAGNOSIS — M25532 Pain in left wrist: Secondary | ICD-10-CM

## 2017-09-11 MED ORDER — OXYCODONE HCL 5 MG PO TABS: 5.0000 mg | ORAL_TABLET | Freq: Three times a day (TID) | ORAL | 0 refills | Status: DC | PRN

## 2017-09-11 NOTE — Telephone Encounter (Signed)
[place] reviewed. Rx printed.

## 2017-10-06 ENCOUNTER — Other Ambulatory Visit: Payer: Self-pay | Admitting: Physician Assistant

## 2017-10-06 DIAGNOSIS — M25532 Pain in left wrist: Secondary | ICD-10-CM

## 2017-10-07 MED ORDER — OXYCODONE HCL 5 MG PO TABS: 5.0000 mg | ORAL_TABLET | Freq: Three times a day (TID) | ORAL | 0 refills | Status: DC | PRN

## 2017-10-22 ENCOUNTER — Encounter: Payer: Self-pay | Admitting: Physician Assistant

## 2017-10-22 DIAGNOSIS — J069 Acute upper respiratory infection, unspecified: Secondary | ICD-10-CM

## 2017-10-22 MED ORDER — BENZONATATE 200 MG PO CAPS: 200.0000 mg | ORAL_CAPSULE | Freq: Two times a day (BID) | ORAL | 0 refills | Status: DC | PRN

## 2017-10-22 MED ORDER — AZITHROMYCIN 250 MG PO TABS: ORAL_TABLET | ORAL | 0 refills | Status: DC

## 2017-10-23 ENCOUNTER — Other Ambulatory Visit: Payer: Self-pay | Admitting: Physician Assistant

## 2017-10-23 DIAGNOSIS — F411 Generalized anxiety disorder: Secondary | ICD-10-CM

## 2017-10-23 MED ORDER — BUPROPION HCL ER (SR) 150 MG PO TB12: 150.0000 mg | ORAL_TABLET | Freq: Two times a day (BID) | ORAL | 1 refills | Status: DC

## 2017-10-23 NOTE — Progress Notes (Signed)
Bupropion refilled to [facility] order pharmacy per request.

## 2017-11-11 ENCOUNTER — Other Ambulatory Visit: Payer: Self-pay | Admitting: Physician Assistant

## 2017-11-11 DIAGNOSIS — M25532 Pain in left wrist: Secondary | ICD-10-CM

## 2017-11-11 MED ORDER — OXYCODONE HCL 5 MG PO TABS: 5.0000 mg | ORAL_TABLET | Freq: Three times a day (TID) | ORAL | 0 refills | Status: DC | PRN

## 2017-11-11 NOTE — Telephone Encounter (Signed)
Last ov 05/14/16

## 2017-11-11 NOTE — Telephone Encounter (Signed)
[place] reviewed. Rx sent in. Please notify patient he needs CPE before more refills

## 2017-12-17 ENCOUNTER — Other Ambulatory Visit: Payer: Self-pay | Admitting: Physician Assistant

## 2017-12-17 DIAGNOSIS — M25532 Pain in left wrist: Secondary | ICD-10-CM

## 2017-12-18 MED ORDER — OXYCODONE HCL 5 MG PO TABS: 5.0000 mg | ORAL_TABLET | Freq: Three times a day (TID) | ORAL | 0 refills | Status: DC | PRN

## 2017-12-31 ENCOUNTER — Encounter: Payer: Self-pay | Admitting: Physician Assistant

## 2018-01-21 ENCOUNTER — Other Ambulatory Visit: Payer: Self-pay | Admitting: Physician Assistant

## 2018-01-21 DIAGNOSIS — M25532 Pain in left wrist: Secondary | ICD-10-CM

## 2018-01-22 MED ORDER — OXYCODONE HCL 5 MG PO TABS: 5.0000 mg | ORAL_TABLET | Freq: Three times a day (TID) | ORAL | 0 refills | Status: DC | PRN

## 2018-01-22 NOTE — Telephone Encounter (Signed)
[place] reviewed

## 2018-02-17 ENCOUNTER — Other Ambulatory Visit: Payer: Self-pay | Admitting: Physician Assistant

## 2018-02-17 DIAGNOSIS — M25532 Pain in left wrist: Secondary | ICD-10-CM

## 2018-02-17 MED ORDER — OXYCODONE HCL 5 MG PO TABS: 5.0000 mg | ORAL_TABLET | Freq: Three times a day (TID) | ORAL | 0 refills | Status: DC | PRN

## 2018-03-18 ENCOUNTER — Other Ambulatory Visit: Payer: Self-pay | Admitting: Physician Assistant

## 2018-03-18 DIAGNOSIS — M25532 Pain in left wrist: Secondary | ICD-10-CM

## 2018-03-18 MED ORDER — OXYCODONE HCL 5 MG PO TABS: 5.0000 mg | ORAL_TABLET | Freq: Three times a day (TID) | ORAL | 0 refills | Status: DC | PRN

## 2018-03-18 NOTE — Telephone Encounter (Signed)
[place] reviewed

## 2018-04-14 ENCOUNTER — Other Ambulatory Visit: Payer: Self-pay | Admitting: Physician Assistant

## 2018-04-14 DIAGNOSIS — K219 Gastro-esophageal reflux disease without esophagitis: Secondary | ICD-10-CM

## 2018-04-14 DIAGNOSIS — F419 Anxiety disorder, unspecified: Secondary | ICD-10-CM

## 2018-04-22 ENCOUNTER — Other Ambulatory Visit: Payer: Self-pay | Admitting: Physician Assistant

## 2018-04-22 DIAGNOSIS — M25532 Pain in left wrist: Secondary | ICD-10-CM

## 2018-04-22 MED ORDER — OXYCODONE HCL 5 MG PO TABS: 5.0000 mg | ORAL_TABLET | Freq: Three times a day (TID) | ORAL | 0 refills | Status: DC | PRN

## 2018-04-22 NOTE — Telephone Encounter (Signed)
[place] reviewed.

## 2018-05-27 ENCOUNTER — Other Ambulatory Visit: Payer: Self-pay | Admitting: Physician Assistant

## 2018-05-27 DIAGNOSIS — M25532 Pain in left wrist: Secondary | ICD-10-CM

## 2018-05-28 MED ORDER — OXYCODONE HCL 5 MG PO TABS: 5.0000 mg | ORAL_TABLET | Freq: Three times a day (TID) | ORAL | 0 refills | Status: DC | PRN

## 2018-05-28 NOTE — Telephone Encounter (Signed)
[place] reviewed

## 2018-06-09 ENCOUNTER — Encounter: Payer: Self-pay | Admitting: Physician Assistant

## 2018-06-09 DIAGNOSIS — F411 Generalized anxiety disorder: Secondary | ICD-10-CM

## 2018-06-09 DIAGNOSIS — K219 Gastro-esophageal reflux disease without esophagitis: Secondary | ICD-10-CM

## 2018-06-09 DIAGNOSIS — E7800 Pure hypercholesterolemia, unspecified: Secondary | ICD-10-CM

## 2018-06-09 DIAGNOSIS — R7309 Other abnormal glucose: Secondary | ICD-10-CM

## 2018-06-09 DIAGNOSIS — Z125 Encounter for screening for malignant neoplasm of prostate: Secondary | ICD-10-CM

## 2018-06-11 LAB — COMPREHENSIVE METABOLIC PANEL
ALT: 84 IU/L — ABNORMAL HIGH (ref 0–44)
AST: 62 IU/L — ABNORMAL HIGH (ref 0–40)
Albumin/Globulin Ratio: 1.6 (ref 1.2–2.2)
Albumin: 4.2 g/dL (ref 3.5–5.5)
Alkaline Phosphatase: 54 IU/L (ref 39–117)
BUN/Creatinine Ratio: 14 (ref 9–20)
BUN: 15 mg/dL (ref 6–24)
Bilirubin Total: 0.6 mg/dL (ref 0.0–1.2)
CO2: 21 mmol/L (ref 20–29)
Calcium: 9.1 mg/dL (ref 8.7–10.2)
Chloride: 102 mmol/L (ref 96–106)
Creatinine, Ser: 1.06 mg/dL (ref 0.76–1.27)
GFR calc Af Amer: 95 mL/min/{1.73_m2} (ref >59)
GFR calc non Af Amer: 83 mL/min/{1.73_m2} (ref >59)
Globulin, Total: 2.6 g/dL (ref 1.5–4.5)
Glucose: 109 mg/dL — ABNORMAL HIGH (ref 65–99)
Potassium: 4.4 mmol/L (ref 3.5–5.2)
Sodium: 138 mmol/L (ref 134–144)
Total Protein: 6.8 g/dL (ref 6.0–8.5)

## 2018-06-11 LAB — HEMOGLOBIN A1C
Est. average glucose Bld gHb Est-mCnc: 108 mg/dL
Hgb A1c MFr Bld: 5.4 % (ref 4.8–5.6)

## 2018-06-11 LAB — CBC WITH DIFFERENTIAL/PLATELET
Basophils Absolute: 0 10*3/uL (ref 0.0–0.2)
Basos: 0 %
EOS (ABSOLUTE): 0.1 10*3/uL (ref 0.0–0.4)
Eos: 2 %
Hematocrit: 46.3 % (ref 37.5–51.0)
Hemoglobin: 16.1 g/dL (ref 13.0–17.7)
Immature Grans (Abs): 0 10*3/uL (ref 0.0–0.1)
Immature Granulocytes: 0 %
Lymphocytes Absolute: 2.9 10*3/uL (ref 0.7–3.1)
Lymphs: 37 %
MCH: 32.2 pg (ref 26.6–33.0)
MCHC: 34.8 g/dL (ref 31.5–35.7)
MCV: 93 fL (ref 79–97)
Monocytes Absolute: 0.8 10*3/uL (ref 0.1–0.9)
Monocytes: 10 %
Neutrophils Absolute: 4 10*3/uL (ref 1.4–7.0)
Neutrophils: 51 %
Platelets: 204 10*3/uL (ref 150–450)
RBC: 5 x10E6/uL (ref 4.14–5.80)
RDW: 12.3 % (ref 12.3–15.4)
WBC: 7.9 10*3/uL (ref 3.4–10.8)

## 2018-06-11 LAB — PSA: Prostate Specific Ag, Serum: 0.3 ng/mL (ref 0.0–4.0)

## 2018-06-11 LAB — LIPID PANEL
Chol/HDL Ratio: 5.2 ratio — ABNORMAL HIGH (ref 0.0–5.0)
Cholesterol, Total: 264 mg/dL — ABNORMAL HIGH (ref 100–199)
HDL: 51 mg/dL (ref >39)
LDL Calculated: 172 mg/dL — ABNORMAL HIGH (ref 0–99)
Triglycerides: 207 mg/dL — ABNORMAL HIGH (ref 0–149)
VLDL Cholesterol Cal: 41 mg/dL — ABNORMAL HIGH (ref 5–40)

## 2018-06-11 LAB — TSH: TSH: 2.24 u[IU]/mL (ref 0.450–4.500)

## 2018-06-13 ENCOUNTER — Ambulatory Visit (INDEPENDENT_AMBULATORY_CARE_PROVIDER_SITE_OTHER): Payer: 59 | Admitting: Physician Assistant

## 2018-06-13 ENCOUNTER — Encounter: Payer: Self-pay | Admitting: Physician Assistant

## 2018-06-13 VITALS — BP 125/83 | HR 84 | Temp 98.0°F | Resp 16 | Ht 72.0 in | Wt 332.0 lb

## 2018-06-13 DIAGNOSIS — R635 Abnormal weight gain: Secondary | ICD-10-CM | POA: Diagnosis not present

## 2018-06-13 DIAGNOSIS — Z23 Encounter for immunization: Secondary | ICD-10-CM | POA: Diagnosis not present

## 2018-06-13 DIAGNOSIS — E559 Vitamin D deficiency, unspecified: Secondary | ICD-10-CM

## 2018-06-13 DIAGNOSIS — R1011 Right upper quadrant pain: Secondary | ICD-10-CM | POA: Diagnosis not present

## 2018-06-13 DIAGNOSIS — R5383 Other fatigue: Secondary | ICD-10-CM

## 2018-06-13 DIAGNOSIS — Z8269 Family history of other diseases of the musculoskeletal system and connective tissue: Secondary | ICD-10-CM | POA: Diagnosis not present

## 2018-06-13 DIAGNOSIS — Z0000 Encounter for general adult medical examination without abnormal findings: Secondary | ICD-10-CM | POA: Diagnosis not present

## 2018-06-13 DIAGNOSIS — M722 Plantar fascial fibromatosis: Secondary | ICD-10-CM

## 2018-06-13 DIAGNOSIS — Z713 Dietary counseling and surveillance: Secondary | ICD-10-CM

## 2018-06-13 DIAGNOSIS — Z6841 Body Mass Index (BMI) 40.0 and over, adult: Secondary | ICD-10-CM

## 2018-06-13 MED ORDER — PHENTERMINE HCL 37.5 MG PO TABS: 37.5000 mg | ORAL_TABLET | Freq: Every day | ORAL | 2 refills | Status: DC

## 2018-06-13 NOTE — Progress Notes (Signed)
Patient: Francisco Oneal, Male    DOB: [birth year], 47 y.o.   MRN: [id number] Visit Date: 06/13/2018  Today's Provider: Mar Daring, PA-C   Chief Complaint  Patient presents with  . Annual Exam   Subjective:    Annual physical exam Francisco Oneal is a 47 y.o. male who presents today for health maintenance and complete physical. He feels well. He reports exercising none. He reports he is sleeping fairly well. Patient c/o lower leg pain and feet pain.    Lab Results  Component Value Date   WBC 7.9 06/10/2018   HGB 16.1 06/10/2018   HCT 46.3 06/10/2018   PLT 204 06/10/2018   GLUCOSE 109 (H) 06/10/2018   CHOL 264 (H) 06/10/2018   TRIG 207 (H) 06/10/2018   HDL 51 06/10/2018   LDLCALC 172 (H) 06/10/2018   ALT 84 (H) 06/10/2018   AST 62 (H) 06/10/2018   NA 138 06/10/2018   K 4.4 06/10/2018   CL 102 06/10/2018   CREATININE 1.06 06/10/2018   BUN 15 06/10/2018   CO2 21 06/10/2018   TSH 2.240 06/10/2018   HGBA1C 5.4 06/10/2018    -----------------------------------------------------------------   Review of Systems  Constitutional: Negative.   HENT: Negative.   Eyes: Negative.   Respiratory: Negative.   Cardiovascular: Negative.   Gastrointestinal: Negative.   Endocrine: Negative.   Genitourinary: Negative.   Musculoskeletal: Positive for arthralgias.  Skin: Negative.   Allergic/Immunologic: Negative.   Neurological: Positive for numbness.  Hematological: Negative.   Psychiatric/Behavioral: Positive for decreased concentration and sleep disturbance. The patient is nervous/anxious.     Social History      He  reports that he has never smoked. He has never used smokeless tobacco. He reports that he drinks about 8.0 standard drinks of alcohol per week.       Social History   Socioeconomic History  . Marital status: Married    Spouse name: Not on file  . Number of children: Not on file  . Years of education: Not on file  . Highest education level: Not  on file  Occupational History  . Not on file  Social Needs  . Financial resource strain: Not on file  . Food insecurity:    Worry: Not on file    Inability: Not on file  . Transportation needs:    Medical: Not on file    Non-medical: Not on file  Tobacco Use  . Smoking status: Never Smoker  . Smokeless tobacco: Never Used  Substance and Sexual Activity  . Alcohol use: Yes    Alcohol/week: 8.0 standard drinks    Types: 8 Glasses of wine per week  . Drug use: Not on file  . Sexual activity: Not on file  Lifestyle  . Physical activity:    Days per week: Not on file    Minutes per session: Not on file  . Stress: Not on file  Relationships  . Social connections:    Talks on phone: Not on file    Gets together: Not on file    Attends religious service: Not on file    Active member of club or organization: Not on file    Attends meetings of clubs or organizations: Not on file    Relationship status: Not on file  Other Topics Concern  . Not on file  Social History Narrative  . Not on file    No past medical history on file.   Patient Active Problem  List   Diagnosis Date Noted  . GERD (gastroesophageal reflux disease) 12/07/2015  . Allergic rhinitis 12/07/2015  . Lateral epicondylitis 12/07/2015  . GAD (generalized anxiety disorder) 12/07/2015  . Left wrist pain 12/07/2015  . Laceration of elbow with complication 19/41/7408    Past Surgical History:  Procedure Laterality Date  . APPENDECTOMY    . INCISION AND DRAINAGE OF WOUND Left 12/08/2014   Procedure: IRRIGATION AND DEBRIDEMENT WOUND;  Surgeon: Corky Mull, MD;  Location: [facility] ORS;  Service: Orthopedics;  Laterality: Left;  . OPEN REDUCTION INTERNAL FIXATION (ORIF) DISTAL RADIAL FRACTURE Left 12/08/2014   Procedure: OPEN REDUCTION INTERNAL FIXATION (ORIF) DISTAL RADIAL FRACTURE;  Surgeon: Corky Mull, MD;  Location: [facility] ORS;  Service: Orthopedics;  Laterality: Left;    Family History        Family Status    Relation Name Status  . Mother  Alive  . Father  Alive  . Son  Alive        His family history includes Cancer in his mother; Emphysema in his mother.      Allergies  Allergen Reactions  . Bee Venom Anaphylaxis  . Penicillins Other (See Comments)    Has patient had a PCN reaction causing immediate rash, facial/tongue/throat swelling, SOB or lightheadedness with hypotension: Yes Has patient had a PCN reaction causing severe rash involving mucus membranes or skin necrosis: Yes Has patient had a PCN reaction that required hospitalization: Yes Has patient had a PCN reaction occurring within the last 10 years: No If all of the above answers are "NO", then may proceed with Cephalosporin use.      Current Outpatient Medications:  .  ALPRAZolam (XANAX) 0.5 MG tablet, TAKE 1/2 TO 1 TABLET TWO   TIMES A DAY AS NEEDED FOR  ANXIETY, Disp: 180 tablet, Rfl: 1 .  oxyCODONE (OXY IR/ROXICODONE) 5 MG immediate release tablet, Take 1 tablet (5 mg total) by mouth every 8 (eight) hours as needed for severe pain., Disp: 60 tablet, Rfl: 0 .  pantoprazole (PROTONIX) 40 MG tablet, TAKE 1 TABLET DAILY, Disp: 90 tablet, Rfl: 3 .  buPROPion (WELLBUTRIN SR) 150 MG 12 hr tablet, Take 1 tablet (150 mg total) by mouth 2 (two) times daily. (Patient not taking: Reported on 06/13/2018), Disp: 180 tablet, Rfl: 1   Patient Care Team: Mar Daring, PA-C as PCP - General (Family Medicine)      Objective:   Vitals: BP 125/83 (BP Location: Left Arm, Patient Position: Sitting, Cuff Size: Large)   Pulse 84   Temp 98 F (36.7 C) (Oral)   Resp 16   Ht 6' (1.829 m)   Wt (!) 332 lb (150.6 kg)   SpO2 96%   BMI 45.03 kg/m    Vitals:   06/13/18 1425  BP: 125/83  Pulse: 84  Resp: 16  Temp: 98 F (36.7 C)  TempSrc: Oral  SpO2: 96%  Weight: (!) 332 lb (150.6 kg)  Height: 6' (1.829 m)     Physical Exam  Constitutional: He is oriented to person, place, and time. He appears well-developed and  well-nourished.  HENT:  Head: Normocephalic and atraumatic.  Right Ear: Hearing, tympanic membrane, external ear and ear canal normal.  Left Ear: Hearing, tympanic membrane, external ear and ear canal normal.  Nose: Nose normal.  Mouth/Throat: Uvula is midline, oropharynx is clear and moist and mucous membranes are normal.  Eyes: Pupils are equal, round, and reactive to light. Conjunctivae and EOM are normal. Right eye  exhibits no discharge.  Neck: Normal range of motion. Neck supple. No tracheal deviation present. No thyromegaly present.  Cardiovascular: Normal rate, regular rhythm, normal heart sounds and intact distal pulses.  No murmur heard. Pulmonary/Chest: Effort normal and breath sounds normal. No respiratory distress. He has no wheezes. He has no rales. He exhibits no tenderness.  Abdominal: Soft. He exhibits no distension and no mass. There is no tenderness. There is no rebound and no guarding.  Musculoskeletal: Normal range of motion. He exhibits no edema or tenderness.  Lymphadenopathy:    He has no cervical adenopathy.  Neurological: He is alert and oriented to person, place, and time. He has normal reflexes. He displays normal reflexes. No cranial nerve deficit. He exhibits normal muscle tone. Coordination normal.  Skin: Skin is warm and dry. No rash noted. No erythema.  Psychiatric: He has a normal mood and affect. His behavior is normal. Judgment and thought content normal.     Depression Screen PHQ 2/9 Scores 06/13/2018  PHQ - 2 Score 0  PHQ- 9 Score 7      Assessment & Plan:     Routine Health Maintenance and Physical Exam  Exercise Activities and Dietary recommendations Goals   None     Immunization History  Administered Date(s) Administered  . Influenza,inj,Quad PF,6+ Mos 04/09/2016  . Td 11/27/2012  . Tdap 11/27/2012    Health Maintenance  Topic Date Due  . HIV Screening  11/27/1984  . INFLUENZA VACCINE  02/06/2018  . TETANUS/TDAP  11/28/2022      Discussed health benefits of physical activity, and encouraged him to engage in regular exercise appropriate for his age and condition.    1. Annual physical exam Labs were completed prior to visit and reviewed today in the room with patient. Immunizations updated.   2. RUQ pain Unsure of cause, possible gallbladder. Does have known fatty liver. Will get [place] as below and f/u pending results.  - US Abdomen Limited RUQ; Future  3. Fatigue, unspecified type Unsure of cause, possibly weight. Will check labs as below and f/u pending results. - Vitamin D (25 hydroxy) - B12 and Folate Panel - Testosterone  4. Weight gain See above medical treatment plan. - Vitamin D (25 hydroxy) - B12 and Folate Panel - Testosterone  5. Family history of gout Will check uric acid for foot/ankle pain at request of patient. Seems to be more likely plantar fasciitis vs arthritis or both due to obesity.  - Uric acid  6. Class 3 severe obesity due to excess calories with serious comorbidity and body mass index (BMI) of 45.0 to 49.9 in adult [facility]) Patient is morbidly obese. Trying to limit bad foods and eat healthier. Will try phentermine as below for appetite suppression. Advised to use food diary and limit to 1500 calories daily. Increase extra physical activity as tolerated.  - phentermine (ADIPEX-P) 37.5 MG tablet; Take 1 tablet (37.5 mg total) by mouth daily before breakfast.  Dispense: 30 tablet; Refill: 2  7. Encounter for weight loss counseling See above medical treatment plan.  8. Plantar fasciitis, bilateral Suspect weight and standing on concrete floors for long periods of time in poor support shoes as source. Discussed conservative management with strecthing, ice, massage, and good arch support. He is going to get insoles from the good feet store in [place] to see if they will help.   9. Need for influenza vaccination Flu vaccine given today without complication. Patient sat upright for  15 minutes to check for  adverse reaction before being released. - Flu Vaccine QUAD 36+ mos IM  The entirety of the information documented in the History of Present Illness, Review of Systems and Physical Exam were personally obtained by me. Portions of this information were initially documented by Lendon Ka, CMA and reviewed by me for thoroughness and accuracy. --------------------------------------------------------------------    Mar Daring, PA-C  [facility]

## 2018-06-13 NOTE — Patient Instructions (Signed)

## 2018-06-18 ENCOUNTER — Other Ambulatory Visit: Payer: Self-pay | Admitting: Physician Assistant

## 2018-06-18 ENCOUNTER — Ambulatory Visit
Admission: RE | Admit: 2018-06-18 | Discharge: 2018-06-18 | Disposition: A | Discharge status: Home / Self Care | Payer: 59 | Source: Ambulatory Visit | Attending: Physician Assistant | Admitting: Physician Assistant

## 2018-06-18 ENCOUNTER — Encounter: Payer: Self-pay | Admitting: Physician Assistant

## 2018-06-18 DIAGNOSIS — K760 Fatty (change of) liver, not elsewhere classified: Secondary | ICD-10-CM | POA: Insufficient documentation

## 2018-06-18 DIAGNOSIS — R1011 Right upper quadrant pain: Secondary | ICD-10-CM

## 2018-06-18 DIAGNOSIS — K7581 Nonalcoholic steatohepatitis (NASH): Secondary | ICD-10-CM

## 2018-06-18 DIAGNOSIS — M25532 Pain in left wrist: Secondary | ICD-10-CM

## 2018-06-18 NOTE — Addendum Note (Signed)
Addended by: Mar Daring on: 06/18/2018 03:40 PM   Modules accepted: Orders

## 2018-06-19 LAB — URIC ACID: Uric Acid: 7.1 mg/dL (ref 3.7–8.6)

## 2018-06-19 LAB — B12 AND FOLATE PANEL
Folate: 7.1 ng/mL (ref >3.0)
Vitamin B-12: 357 pg/mL (ref 232–1245)

## 2018-06-19 LAB — TESTOSTERONE: Testosterone: 442 ng/dL (ref 264–916)

## 2018-06-19 LAB — VITAMIN D 25 HYDROXY (VIT D DEFICIENCY, FRACTURES): Vit D, 25-Hydroxy: 17.4 ng/mL — ABNORMAL LOW (ref 30.0–100.0)

## 2018-06-20 ENCOUNTER — Telehealth: Payer: Self-pay

## 2018-06-20 MED ORDER — VITAMIN D (ERGOCALCIFEROL) 1.25 MG (50000 UNIT) PO CAPS: 50000.0000 [IU] | ORAL_CAPSULE | ORAL | 0 refills | Status: DC

## 2018-06-20 MED ORDER — OXYCODONE HCL 5 MG PO TABS: 5.0000 mg | ORAL_TABLET | Freq: Three times a day (TID) | ORAL | 0 refills | Status: DC | PRN

## 2018-06-20 NOTE — Telephone Encounter (Signed)
-----   Message from Mar Daring, [place] sent at 06/18/2018  2:24 PM EST ----- Gallbladder normal. There is most likely hepatic steatosis (fatty liver). If you desire I can refer you to GI for further work up for this and see if treatments could be adjusted. Let me know your thoughts.

## 2018-06-20 NOTE — Addendum Note (Signed)
Addended by: Mar Daring on: 06/20/2018 04:26 PM   Modules accepted: Orders

## 2018-06-20 NOTE — Telephone Encounter (Signed)
Viewed by Gypsy Lore on 06/18/2018 2:28 PM

## 2018-06-23 ENCOUNTER — Encounter: Payer: Self-pay | Admitting: Gastroenterology

## 2018-06-23 ENCOUNTER — Ambulatory Visit (INDEPENDENT_AMBULATORY_CARE_PROVIDER_SITE_OTHER): Payer: 59 | Admitting: Gastroenterology

## 2018-06-23 ENCOUNTER — Telehealth: Payer: Self-pay

## 2018-06-23 VITALS — BP 135/88 | HR 91 | Ht 72.0 in | Wt 322.6 lb

## 2018-06-23 DIAGNOSIS — R945 Abnormal results of liver function studies: Secondary | ICD-10-CM | POA: Diagnosis not present

## 2018-06-23 DIAGNOSIS — R7989 Other specified abnormal findings of blood chemistry: Secondary | ICD-10-CM

## 2018-06-23 NOTE — Progress Notes (Signed)
Jonathon Bellows MD, MRCP(U.K) [address]  [place]  [place], [place] [address]  Main: [phone]  Fax: [phone]   Gastroenterology Consultation  Referring Provider:     Florian Buff* Primary Care Physician:  Mar Daring, PA-C Primary Gastroenterologist:  Dr. Jonathon Bellows  Reason for Consultation:     NASH        HPI:   Francisco Oneal is a 47 y.o. y/o male referred for consultation & management  by Dr. Marlyn Corporal, Clearnce Sorrel, PA-C.     RUQ USG 06/18/18 : Hepatic steatosis.   06/10/18 : AST 62, ALT 84, albumin 4.2, Tbil 0.6,plt 204 Transaminases been abnormal for over 5 years.   He Says that he has been having some abdominal pain RUQ- several months, not getting worse. Not related to food. Not related to movement . Not eating much due to stress but gaining weight.Normal bowel movements daily, pain is not better after a bowel movement. Takes occaisonal tylenol and ibuprofen but not on a daily basis. Denies any family history of colon cancer or polyps. Consumes alcohol over the weekends. Denies every drinking a lot. He weighed his maximum last week. He has tatoos professional . Served in [employer], no blood transfusions. Never used any illegal; drugs. No health supplements. No herbal medications or OTC meds. ++++   No past medical history on file.  Past Surgical History:  Procedure Laterality Date  . APPENDECTOMY    . INCISION AND DRAINAGE OF WOUND Left 12/08/2014   Procedure: IRRIGATION AND DEBRIDEMENT WOUND;  Surgeon: Corky Mull, MD;  Location: [facility] ORS;  Service: Orthopedics;  Laterality: Left;  . OPEN REDUCTION INTERNAL FIXATION (ORIF) DISTAL RADIAL FRACTURE Left 12/08/2014   Procedure: OPEN REDUCTION INTERNAL FIXATION (ORIF) DISTAL RADIAL FRACTURE;  Surgeon: Corky Mull, MD;  Location: [facility] ORS;  Service: Orthopedics;  Laterality: Left;    Prior to Admission medications   Medication Sig Start Date End Date Taking? Authorizing Provider  ALPRAZolam  Duanne Moron) 0.5 MG tablet TAKE 1/2 TO 1 TABLET TWO   TIMES A DAY AS NEEDED FOR  ANXIETY 04/15/18   Mar Daring, PA-C  oxyCODONE (OXY IR/ROXICODONE) 5 MG immediate release tablet Take 1 tablet (5 mg total) by mouth every 8 (eight) hours as needed for severe pain. 06/20/18   Mar Daring, PA-C  pantoprazole (PROTONIX) 40 MG tablet TAKE 1 TABLET DAILY 04/15/18   Mar Daring, PA-C  phentermine (ADIPEX-P) 37.5 MG tablet Take 1 tablet (37.5 mg total) by mouth daily before breakfast. 06/13/18   Mar Daring, PA-C  Vitamin D, Ergocalciferol, (DRISDOL) 1.25 MG (50000 UT) CAPS capsule Take 1 capsule (50,000 Units total) by mouth every 7 (seven) days. 06/20/18   Mar Daring, PA-C    Family History  Problem Relation Age of Onset  . Emphysema Mother   . Cancer Mother      Social History   Tobacco Use  . Smoking status: Never Smoker  . Smokeless tobacco: Never Used  Substance Use Topics  . Alcohol use: Yes    Alcohol/week: 8.0 standard drinks    Types: 8 Glasses of wine per week  . Drug use: Not on file    Allergies as of 06/23/2018 - Review Complete 06/23/2018  Allergen Reaction Noted  . Bee venom Anaphylaxis 12/08/2014  . Penicillins Other (See Comments) 12/08/2014    Review of Systems:    All systems reviewed and negative except where noted in HPI.   Physical  Exam:  BP 135/88   Pulse 91   Ht 6' (1.829 m)   Wt (!) 322 lb 9.6 oz (146.3 kg)   BMI 43.75 kg/m  No LMP for male patient. Psych:  Alert and cooperative. Normal mood and affect. General:   Alert,  Well-developed, well-nourished, pleasant and cooperative in NAD Head:  Normocephalic and atraumatic. Eyes:  Sclera clear, no icterus.   Conjunctiva pink. Ears:  Normal auditory acuity. Nose:  No deformity, discharge, or lesions. Mouth:  No deformity or lesions,oropharynx pink & moist. Neck:  Supple; no masses or thyromegaly. Lungs:  Respirations even and unlabored.  Clear throughout to  auscultation.   No wheezes, crackles, or rhonchi. No acute distress. Heart:  Regular rate and rhythm; no murmurs, clicks, rubs, or gallops. Abdomen:  Normal bowel sounds.  No bruits.  Soft, non-tender and non-distended without masses, hepatosplenomegaly or hernias noted.  No guarding or rebound tenderness.    Msk:  Symmetrical without gross deformities. Good, equal movement & strength bilaterally. Pulses:  Normal pulses noted. Extremities:  No clubbing or edema.  No cyanosis. Neurologic:  Alert and oriented x3;  grossly normal neurologically. Skin:  Intact without significant lesions or rashes. No jaundice. Lymph Nodes:  No significant cervical adenopathy. Psych:  Alert and cooperative. Normal mood and affect.  Imaging Studies: US Abdomen Limited Ruq  Result Date: 06/18/2018 CLINICAL DATA:  Upper abdominal pain with elevated liver enzymes EXAM: ULTRASOUND ABDOMEN LIMITED RIGHT UPPER QUADRANT COMPARISON:  CT abdomen and pelvis December 17, 2016 FINDINGS: Gallbladder: No gallstones or wall thickening visualized. There is no pericholecystic fluid. No sonographic Murphy sign noted by sonographer. Common bile duct: Diameter: 3 mm. No intrahepatic or extrahepatic biliary duct dilatation. Liver: No focal lesion identified. Liver echogenicity is increased diffusely. Portal vein is patent on color Doppler imaging with normal direction of blood flow towards the liver. IMPRESSION: Diffuse increase in liver echogenicity, a finding felt to be indicative of hepatic steatosis. While no focal liver lesions are evident on this study, it must be cautioned that the sensitivity of ultrasound for detection of focal liver lesions is diminished in this circumstance. Study otherwise unremarkable. Electronically Signed   By: Lowella Grip III M.D.   On: 06/18/2018 13:54    Assessment and Plan:   Francisco Oneal is a 47 y.o. y/o male has been referred for abnormal ultrasound. Noted to have abnormal LFT's ongoing for > 5  years likely from NAFLD., Counseled on life style changes , provided patient information. Will rule out autoimmune, viral hepatitis, genetic conditions that cause liver disease. Some vague RUQ abdominal discomfort- likely related to spasm. Advised to watch if no better in a few weeks will consider further work up     Follow up in 6-8 weeks   Dr Jonathon Bellows MD,MRCP(U.K)

## 2018-06-23 NOTE — Telephone Encounter (Signed)
Viewed by Gypsy Lore on 06/20/2018 4:47 PM

## 2018-06-23 NOTE — Telephone Encounter (Signed)
-----   Message from Mar Daring, [place] sent at 06/20/2018  4:25 PM EST ----- B12 and folate normal. Uric acid normal. Testosterone normal. Vit D is low. I will send in high dose supplement to take for 3 months then transition to OTC supplement of 1000-2000 IU daily.

## 2018-06-25 LAB — CERULOPLASMIN: Ceruloplasmin: 24.2 mg/dL (ref 16.0–31.0)

## 2018-06-25 LAB — IRON,TIBC AND FERRITIN PANEL
Ferritin: 585 ng/mL — ABNORMAL HIGH (ref 30–400)
Iron Saturation: 29 % (ref 15–55)
Iron: 92 ug/dL (ref 38–169)
Total Iron Binding Capacity: 316 ug/dL (ref 250–450)
UIBC: 224 ug/dL (ref 111–343)

## 2018-06-25 LAB — HEPATITIS B E ANTIBODY: Hep B E Ab: NEGATIVE

## 2018-06-25 LAB — IMMUNOGLOBULINS A/E/G/M, SERUM
IgA/Immunoglobulin A, Serum: 254 mg/dL (ref 90–386)
IgE (Immunoglobulin E), Serum: 350 IU/mL (ref 6–495)
IgG (Immunoglobin G), Serum: 1421 mg/dL (ref 700–1600)
IgM (Immunoglobulin M), Srm: 234 mg/dL — ABNORMAL HIGH (ref 20–172)

## 2018-06-25 LAB — ANTI-MICROSOMAL ANTIBODY LIVER / KIDNEY: LKM1 Ab: 0.6 Units (ref 0.0–20.0)

## 2018-06-25 LAB — HEPATITIS B E ANTIGEN: Hep B E Ag: NEGATIVE

## 2018-06-25 LAB — HEPATITIS B CORE ANTIBODY, TOTAL: Hep B Core Total Ab: NEGATIVE

## 2018-06-25 LAB — HEPATITIS B SURFACE ANTIGEN: Hepatitis B Surface Ag: NEGATIVE

## 2018-06-25 LAB — CELIAC DISEASE AB SCREEN W/RFX
Antigliadin Abs, IgA: 3 units (ref 0–19)
Transglutaminase IgA: <2 U/mL (ref 0–3)

## 2018-06-25 LAB — HEPATITIS A ANTIBODY, TOTAL: Hep A Total Ab: NEGATIVE

## 2018-06-25 LAB — CK: Total CK: 119 U/L (ref 24–204)

## 2018-06-25 LAB — MITOCHONDRIAL/SMOOTH MUSCLE AB PNL
Mitochondrial Ab: <20 Units (ref 0.0–20.0)
Smooth Muscle Ab: 10 Units (ref 0–19)

## 2018-06-25 LAB — HEPATITIS B SURFACE ANTIBODY,QUALITATIVE: Hep B Surface Ab, Qual: NONREACTIVE

## 2018-06-25 LAB — ALPHA-1-ANTITRYPSIN: A-1 Antitrypsin: 157 mg/dL (ref 101–187)

## 2018-06-25 LAB — ANA: Anti Nuclear Antibody(ANA): NEGATIVE

## 2018-06-25 LAB — HEPATITIS C ANTIBODY: Hep C Virus Ab: <0.1 s/co ratio (ref 0.0–0.9)

## 2018-06-25 LAB — HIV ANTIBODY (ROUTINE TESTING W REFLEX): HIV Screen 4th Generation wRfx: NONREACTIVE

## 2018-06-30 ENCOUNTER — Telehealth: Payer: Self-pay

## 2018-06-30 NOTE — Telephone Encounter (Signed)
Spoke with pt and informed him of lab results and Dr. Georgeann Oppenheim directions for pt to receive Hep A/B vaccination and repeat labs in 3-4 months. I explained to pt that we offer the Hep A/B combination vaccine at our office. Pt plans to begin receiving the vaccines when he returns for the repeat labs.

## 2018-06-30 NOTE — Telephone Encounter (Signed)
-----   Message from Jonathon Bellows, MD sent at 06/29/2018  4:28 PM EST ----- Sherald Hess inform liver work up was essentially normal - Needs hep A/B vaccine- please schedule. Ferritin elevated likely due to fattly liver, normal Iron % saturation makes it less likely for Hemochromotosis. IGM is elevated mildly- repeat it in 3-4 months .   C/c Mar Daring, PA-C   Dr Jonathon Bellows MD,MRCP [facility]) Gastroenterology/Hepatology Pager: [phone]

## 2018-07-01 ENCOUNTER — Encounter: Payer: Self-pay | Admitting: Physician Assistant

## 2018-07-14 ENCOUNTER — Encounter: Payer: Self-pay | Admitting: Physician Assistant

## 2018-07-24 ENCOUNTER — Other Ambulatory Visit: Payer: Self-pay | Admitting: Physician Assistant

## 2018-07-24 DIAGNOSIS — M25532 Pain in left wrist: Secondary | ICD-10-CM

## 2018-07-25 MED ORDER — OXYCODONE HCL 5 MG PO TABS: 5.0000 mg | ORAL_TABLET | Freq: Three times a day (TID) | ORAL | 0 refills | Status: DC | PRN

## 2018-08-04 ENCOUNTER — Other Ambulatory Visit: Payer: Self-pay | Admitting: Physician Assistant

## 2018-08-04 DIAGNOSIS — F419 Anxiety disorder, unspecified: Secondary | ICD-10-CM

## 2018-08-04 DIAGNOSIS — K219 Gastro-esophageal reflux disease without esophagitis: Secondary | ICD-10-CM

## 2018-08-04 MED ORDER — PANTOPRAZOLE SODIUM 40 MG PO TBEC: 40.0000 mg | DELAYED_RELEASE_TABLET | Freq: Every day | ORAL | 3 refills | Status: DC

## 2018-08-04 MED ORDER — ALPRAZOLAM 0.5 MG PO TABS: ORAL_TABLET | ORAL | 1 refills | Status: DC

## 2018-08-04 NOTE — Telephone Encounter (Signed)
[facility] faxed refill request for the following medications:  pantoprazole (PROTONIX) 40 MG tablet  ALPRAZolam (XANAX) 0.5 MG tablet     Please advise.

## 2018-08-07 ENCOUNTER — Ambulatory Visit: Payer: 59 | Admitting: Gastroenterology

## 2018-08-12 ENCOUNTER — Encounter: Payer: Self-pay | Admitting: Physician Assistant

## 2018-08-12 DIAGNOSIS — Z713 Dietary counseling and surveillance: Secondary | ICD-10-CM

## 2018-08-12 DIAGNOSIS — Z6841 Body Mass Index (BMI) 40.0 and over, adult: Secondary | ICD-10-CM

## 2018-08-13 MED ORDER — LIRAGLUTIDE -WEIGHT MANAGEMENT 18 MG/3ML ~~LOC~~ SOPN: 0.6000 mg | PEN_INJECTOR | Freq: Every day | SUBCUTANEOUS | 5 refills | Status: DC

## 2018-09-08 ENCOUNTER — Ambulatory Visit (INDEPENDENT_AMBULATORY_CARE_PROVIDER_SITE_OTHER): Payer: BLUE CROSS/BLUE SHIELD | Admitting: Physician Assistant

## 2018-09-08 ENCOUNTER — Encounter: Payer: Self-pay | Admitting: Physician Assistant

## 2018-09-08 VITALS — BP 148/100 | HR 106 | Temp 98.0°F | Resp 16 | Ht 72.0 in | Wt 295.6 lb

## 2018-09-08 DIAGNOSIS — Z6841 Body Mass Index (BMI) 40.0 and over, adult: Secondary | ICD-10-CM | POA: Diagnosis not present

## 2018-09-08 DIAGNOSIS — M62838 Other muscle spasm: Secondary | ICD-10-CM | POA: Diagnosis not present

## 2018-09-08 DIAGNOSIS — M25532 Pain in left wrist: Secondary | ICD-10-CM

## 2018-09-08 MED ORDER — METHOCARBAMOL 500 MG PO TABS: 500.0000 mg | ORAL_TABLET | Freq: Three times a day (TID) | ORAL | 1 refills | Status: DC | PRN

## 2018-09-08 MED ORDER — OXYCODONE HCL 5 MG PO TABS: 5.0000 mg | ORAL_TABLET | Freq: Three times a day (TID) | ORAL | 0 refills | Status: DC | PRN

## 2018-09-08 MED ORDER — PHENTERMINE HCL 37.5 MG PO TABS: 37.5000 mg | ORAL_TABLET | Freq: Every day | ORAL | 2 refills | Status: DC

## 2018-09-08 NOTE — Progress Notes (Signed)
Patient: Francisco Oneal Male    DOB: 12-May-1971   48 y.o.   MRN: [id number] Visit Date: 09/08/2018  Today's Provider: Mar Daring, PA-C   Chief Complaint  Patient presents with  . Follow-up    weight   Subjective:     HPI   Follow up for weight  The patient was last seen for this 3 months ago. Changes made at last visit include no changes.  He reports excellent compliance with treatment. He feels that condition is Improved. He is not having side effects.   Wt Readings from Last 3 Encounters:  09/08/18 295 lb 9.6 oz (134.1 kg)  06/23/18 (!) 322 lb 9.6 oz (146.3 kg)  06/13/18 (!) 332 lb (150.6 kg)   ------------------------------------------------------------------------------------   Allergies  Allergen Reactions  . Bee Venom Anaphylaxis  . Penicillins Other (See Comments)    Has patient had a PCN reaction causing immediate rash, facial/tongue/throat swelling, SOB or lightheadedness with hypotension: Yes Has patient had a PCN reaction causing severe rash involving mucus membranes or skin necrosis: Yes Has patient had a PCN reaction that required hospitalization: Yes Has patient had a PCN reaction occurring within the last 10 years: No If all of the above answers are "NO", then may proceed with Cephalosporin use.      Current Outpatient Medications:  .  ALPRAZolam (XANAX) 0.5 MG tablet, TAKE 1/2 TO 1 TABLET TWO   TIMES A DAY AS NEEDED FOR  ANXIETY, Disp: 180 tablet, Rfl: 1 .  oxyCODONE (OXY IR/ROXICODONE) 5 MG immediate release tablet, Take 1 tablet (5 mg total) by mouth every 8 (eight) hours as needed for severe pain., Disp: 60 tablet, Rfl: 0 .  pantoprazole (PROTONIX) 40 MG tablet, Take 1 tablet (40 mg total) by mouth daily., Disp: 90 tablet, Rfl: 3 .  phentermine (ADIPEX-P) 37.5 MG tablet, Take 1 tablet (37.5 mg total) by mouth daily before breakfast., Disp: 30 tablet, Rfl: 2 .  Vitamin D, Ergocalciferol, (DRISDOL) 1.25 MG (50000 UT) CAPS capsule, Take  1 capsule (50,000 Units total) by mouth every 7 (seven) days., Disp: 12 capsule, Rfl: 0 .  Liraglutide -Weight Management (SAXENDA) 18 MG/3ML SOPN, Inject 0.6 mg into the skin daily. Increase by 0.85m weekly until max dose of 380macheived, Disp: 3 mL, Rfl: 5  Review of Systems  Constitutional: Negative.   Respiratory: Negative.   Cardiovascular: Negative.   Endocrine: Negative.   Psychiatric/Behavioral: Negative.     Social History   Tobacco Use  . Smoking status: Never Smoker  . Smokeless tobacco: Never Used  Substance Use Topics  . Alcohol use: Yes    Alcohol/week: 8.0 standard drinks    Types: 8 Glasses of wine per week      Objective:   BP (!) 148/100 (BP Location: Left Arm, Patient Position: Sitting, Cuff Size: Large)   Pulse (!) 106   Temp 98 F (36.7 C) (Oral)   Resp 16   Ht 6' (1.829 m)   Wt 295 lb 9.6 oz (134.1 kg)   BMI 40.09 kg/m  Vitals:   09/08/18 0810  BP: (!) 148/100  Pulse: (!) 106  Resp: 16  Temp: 98 F (36.7 C)  TempSrc: Oral  Weight: 295 lb 9.6 oz (134.1 kg)  Height: 6' (1.829 m)     Physical Exam Vitals signs reviewed.  Constitutional:      General: He is not in acute distress.    Appearance: Normal appearance. He is well-developed. He is  obese. He is not diaphoretic.  HENT:     Head: Normocephalic and atraumatic.  Neck:     Musculoskeletal: Normal range of motion and neck supple.  Cardiovascular:     Rate and Rhythm: Normal rate and regular rhythm.     Heart sounds: Normal heart sounds. No murmur. No friction rub. No gallop.   Pulmonary:     Effort: Pulmonary effort is normal. No respiratory distress.     Breath sounds: Normal breath sounds. No wheezing or rales.  Neurological:     Mental Status: He is alert.         Assessment & Plan    1. Class 3 severe obesity due to excess calories with serious comorbidity and body mass index (BMI) of 45.0 to 49.9 in adult [facility]) Patient is doing well. Has lost 37 pounds over the last 3  months. Reports making lifestyle changes. Not drinking alcohol any longer. Has become more active. Will continue phentermine at this time. I will see him back in 3 months.  - phentermine (ADIPEX-P) 37.5 MG tablet; Take 1 tablet (37.5 mg total) by mouth daily before breakfast.  Dispense: 30 tablet; Refill: 2  2. Left wrist pain Stable. Diagnosis pulled for medication refill. Continue current medical treatment plan. - oxyCODONE (OXY IR/ROXICODONE) 5 MG immediate release tablet; Take 1 tablet (5 mg total) by mouth every 8 (eight) hours as needed for severe pain.  Dispense: 60 tablet; Refill: 0  3. Muscle spasm Having cramps at night from increased activity. Will start robaxin as below at bedtime.  - methocarbamol (ROBAXIN) 500 MG tablet; Take 1 tablet (500 mg total) by mouth every 8 (eight) hours as needed for muscle spasms.  Dispense: 90 tablet; Refill: [place], PA-C  [facility]

## 2018-10-12 ENCOUNTER — Other Ambulatory Visit: Payer: Self-pay | Admitting: Physician Assistant

## 2018-10-12 DIAGNOSIS — M25532 Pain in left wrist: Secondary | ICD-10-CM

## 2018-10-13 MED ORDER — OXYCODONE HCL 5 MG PO TABS: 5.0000 mg | ORAL_TABLET | Freq: Three times a day (TID) | ORAL | 0 refills | Status: DC | PRN

## 2018-10-26 ENCOUNTER — Other Ambulatory Visit: Payer: Self-pay | Admitting: Physician Assistant

## 2018-10-26 DIAGNOSIS — M62838 Other muscle spasm: Secondary | ICD-10-CM

## 2018-11-11 ENCOUNTER — Other Ambulatory Visit: Payer: Self-pay | Admitting: Physician Assistant

## 2018-11-11 DIAGNOSIS — Z6841 Body Mass Index (BMI) 40.0 and over, adult: Principal | ICD-10-CM

## 2018-11-11 DIAGNOSIS — M25532 Pain in left wrist: Secondary | ICD-10-CM

## 2018-11-11 MED ORDER — OXYCODONE HCL 5 MG PO TABS: 5.0000 mg | ORAL_TABLET | Freq: Three times a day (TID) | ORAL | 0 refills | Status: DC | PRN

## 2018-11-27 ENCOUNTER — Encounter: Payer: Self-pay | Admitting: Physician Assistant

## 2018-11-27 DIAGNOSIS — E6609 Other obesity due to excess calories: Secondary | ICD-10-CM

## 2018-12-03 MED ORDER — LIRAGLUTIDE -WEIGHT MANAGEMENT 18 MG/3ML ~~LOC~~ SOPN: 0.6000 mg | PEN_INJECTOR | Freq: Every day | SUBCUTANEOUS | 2 refills | Status: DC

## 2018-12-03 MED ORDER — INSULIN PEN NEEDLE 32G X 4 MM MISC: 0 refills | Status: DC

## 2018-12-03 NOTE — Addendum Note (Signed)
Addended by: Mar Daring on: 12/03/2018 01:34 PM   Modules accepted: Orders

## 2018-12-10 ENCOUNTER — Encounter: Payer: Self-pay | Admitting: Physician Assistant

## 2018-12-10 ENCOUNTER — Ambulatory Visit: Payer: Self-pay | Admitting: Physician Assistant

## 2018-12-10 ENCOUNTER — Other Ambulatory Visit: Payer: Self-pay | Admitting: Physician Assistant

## 2018-12-10 DIAGNOSIS — M25532 Pain in left wrist: Secondary | ICD-10-CM

## 2018-12-10 DIAGNOSIS — M62838 Other muscle spasm: Secondary | ICD-10-CM

## 2018-12-10 MED ORDER — OXYCODONE HCL 5 MG PO TABS: 5.0000 mg | ORAL_TABLET | Freq: Three times a day (TID) | ORAL | 0 refills | Status: DC | PRN

## 2018-12-10 NOTE — Telephone Encounter (Signed)
Spoke with patient and advised of the denial letter for Saxenda and explained why it was denied.  Patient also asked what was the contraindication of contrave with the oxycodone and explained to him that part of the Contrave reverse the effect of opoid (makes the pain medication less effective) per Carles Collet, PA-C. Patient asking if we can still give it a try or is there something else/

## 2018-12-11 NOTE — Progress Notes (Signed)
Virtual Visit via Video Note  I connected with Francisco Oneal on 12/12/18 at  3:40 PM EDT by a video enabled telemedicine application and verified that I am speaking with the correct person using two identifiers.  Location: Patient: Work Provider: [facility]   I discussed the limitations of evaluation and management by telemedicine and the availability of in person appointments. The patient expressed understanding and agreed to proceed.  Mar Daring, PA-C   Patient: Boomer Oneal Male    DOB: Mar 18, 1971   48 y.o.   MRN: [id number] Visit Date: 12/12/2018  Today's Provider: Mar Daring, PA-C   Chief Complaint  Patient presents with  . Follow-up    Weight   Subjective:    HPI   Patient doing Virtual visit with provider for his weight loss counseling. He has been using Phentermine, sticking to 2000 calorie diet and increased physical activity. Since starting his weight loss journey he has went from 322 pounds to 273 pounds. This is a total of 6.57% total body weight lost.    Wt Readings from Last 3 Encounters:  12/12/18 273 lb 8 oz (124.1 kg)  09/08/18 295 lb 9.6 oz (134.1 kg)  06/23/18 (!) 322 lb 9.6 oz (146.3 kg)     Allergies  Allergen Reactions  . Bee Venom Anaphylaxis  . Penicillins Other (See Comments)    Has patient had a PCN reaction causing immediate rash, facial/tongue/throat swelling, SOB or lightheadedness with hypotension: Yes Has patient had a PCN reaction causing severe rash involving mucus membranes or skin necrosis: Yes Has patient had a PCN reaction that required hospitalization: Yes Has patient had a PCN reaction occurring within the last 10 years: No If all of the above answers are "NO", then may proceed with Cephalosporin use.      Current Outpatient Medications:  .  ALPRAZolam (XANAX) 0.5 MG tablet, TAKE 1/2 TO 1 TABLET TWO   TIMES A DAY AS NEEDED FOR  ANXIETY, Disp: 180 tablet, Rfl: 1 .  Insulin Pen Needle (BD PEN NEEDLE NANO 2ND GEN) 32G  X 4 MM MISC, To use with saxenda injections, Disp: 100 each, Rfl: 0 .  methocarbamol (ROBAXIN) 500 MG tablet, TAKE ONE TABLET BY MOUTH EVERY EIGHT HOURS AS NEEDED FOR MUSCLE SPASMS, Disp: 90 tablet, Rfl: 1 .  oxyCODONE (OXY IR/ROXICODONE) 5 MG immediate release tablet, Take 1 tablet (5 mg total) by mouth every 8 (eight) hours as needed for severe pain., Disp: 60 tablet, Rfl: 0 .  pantoprazole (PROTONIX) 40 MG tablet, Take 1 tablet (40 mg total) by mouth daily., Disp: 90 tablet, Rfl: 3 .  VITAMIN D PO, Take by mouth., Disp: , Rfl:  .  Liraglutide -Weight Management (SAXENDA) 18 MG/3ML SOPN, Inject 0.6 mg into the skin daily. May increase by 0.62m weekly as tolerated until max dose of 370mdaily achieved (Patient not taking: Reported on 12/12/2018), Disp: 15 mL, Rfl: 2 .  Vitamin D, Ergocalciferol, (DRISDOL) 1.25 MG (50000 UT) CAPS capsule, Take 1 capsule (50,000 Units total) by mouth every 7 (seven) days., Disp: 12 capsule, Rfl: 0  Review of Systems  Constitutional: Negative.   Respiratory: Negative.   Cardiovascular: Negative.   Musculoskeletal: Negative.   Neurological: Negative.   Psychiatric/Behavioral: Negative.     Social History   Tobacco Use  . Smoking status: Never Smoker  . Smokeless tobacco: Never Used  Substance Use Topics  . Alcohol use: Yes    Alcohol/week: 8.0 standard drinks    Types: 8  Glasses of wine per week      Objective:   Wt 273 lb 8 oz (124.1 kg)   BMI 37.09 kg/m  Vitals:   12/12/18 1517  Weight: 273 lb 8 oz (124.1 kg)     Physical Exam Vitals signs reviewed.  Constitutional:      General: He is not in acute distress.    Appearance: Normal appearance. He is well-developed. He is obese. He is not ill-appearing.  HENT:     Head: Normocephalic and atraumatic.  Eyes:     Conjunctiva/sclera: Conjunctivae normal.  Neck:     Musculoskeletal: Normal range of motion and neck supple.  Pulmonary:     Effort: Pulmonary effort is normal. No respiratory  distress.  Neurological:     Mental Status: He is alert.  Psychiatric:        Mood and Affect: Mood normal.        Behavior: Behavior normal.        Thought Content: Thought content normal.        Judgment: Judgment normal.         Assessment & Plan    1. Class 2 severe obesity due to excess calories with serious comorbidity and body mass index (BMI) of 37.0 to 37.9 in adult [facility]) Doing well. Has lost from 322 pounds to 273 pounds in last 6 months. This is a total of 6.57% total body weight. He has been continuing on his own without medication for the last few weeks. He would like to continue on his own since [place] was denied by his insurance. 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 He is to call if he starts gaining weight and will consider restarting phentermine.   2. GAD (generalized anxiety disorder) Worsening again. Will restart Wellbutrin as below. Call if not improving.  - buPROPion (WELLBUTRIN SR) 150 MG 12 hr tablet; Take 1 tablet (150 mg total) by mouth 2 (two) times daily.  Dispense: 180 tablet; Refill: 1    I discussed the assessment and treatment plan with the patient. The patient was provided an opportunity to ask questions and all were answered. The patient agreed with the plan and demonstrated an understanding of the instructions.   The patient was advised to call back or seek an in-person evaluation if the symptoms worsen or if the condition fails to improve as anticipated.  I provided 14 minutes of non-face-to-face time during this encounter.   Mar Daring, PA-C  [facility]

## 2018-12-12 ENCOUNTER — Other Ambulatory Visit: Payer: Self-pay

## 2018-12-12 ENCOUNTER — Ambulatory Visit: Payer: BLUE CROSS/BLUE SHIELD | Admitting: Physician Assistant

## 2018-12-12 ENCOUNTER — Encounter: Payer: Self-pay | Admitting: Physician Assistant

## 2018-12-12 ENCOUNTER — Ambulatory Visit (INDEPENDENT_AMBULATORY_CARE_PROVIDER_SITE_OTHER): Payer: BC Managed Care – PPO | Admitting: Physician Assistant

## 2018-12-12 VITALS — Wt 273.5 lb

## 2018-12-12 DIAGNOSIS — F411 Generalized anxiety disorder: Secondary | ICD-10-CM

## 2018-12-12 DIAGNOSIS — Z6837 Body mass index (BMI) 37.0-37.9, adult: Secondary | ICD-10-CM | POA: Diagnosis not present

## 2018-12-12 MED ORDER — BUPROPION HCL ER (SR) 150 MG PO TB12: 150.0000 mg | ORAL_TABLET | Freq: Two times a day (BID) | ORAL | 1 refills | Status: DC

## 2018-12-14 ENCOUNTER — Other Ambulatory Visit: Payer: Self-pay | Admitting: Physician Assistant

## 2018-12-14 DIAGNOSIS — E559 Vitamin D deficiency, unspecified: Secondary | ICD-10-CM

## 2018-12-15 MED ORDER — VITAMIN D (ERGOCALCIFEROL) 1.25 MG (50000 UNIT) PO CAPS: 50000.0000 [IU] | ORAL_CAPSULE | ORAL | 0 refills | Status: DC

## 2018-12-16 ENCOUNTER — Telehealth: Payer: Self-pay | Admitting: Physician Assistant

## 2018-12-16 NOTE — Telephone Encounter (Signed)
[employer] wanted to make sure we had received the appeal information for Liraglutide -Weight Management (SAXENDA) 18 MG/3ML SOPN. They are going to re-fax the appeal information and stated we can call [employer] [phone] to complete the appeal process. Please advise. Thanks TNP

## 2018-12-17 ENCOUNTER — Telehealth: Payer: Self-pay

## 2018-12-17 NOTE — Telephone Encounter (Signed)
Received fax and passed it on to [place].

## 2018-12-17 NOTE — Telephone Encounter (Signed)
-----   Message from Francisco Oneal, [place] sent at 06/30/2018 11:38 AM EST ----- Regarding: Pt due for labs and Hep vaccine Pt is due for repeat labs (Immunoglobulins, QN, A/E/G/M and Iron, TIBC and Ferritin Panel)  And his first Hep A/B vaccine.  Call pt to schedule nurse visit (during lab hours)  (Pt requested to wait until repeat labs are due to start Hep vaccine)

## 2018-12-17 NOTE — Telephone Encounter (Signed)
Called pt to remind him he is due for repeat labs and his first Hep A/B vaccine.  Unable to contact, LVM to return call

## 2018-12-24 ENCOUNTER — Other Ambulatory Visit: Payer: Self-pay | Admitting: Physician Assistant

## 2018-12-24 DIAGNOSIS — F419 Anxiety disorder, unspecified: Secondary | ICD-10-CM

## 2018-12-30 ENCOUNTER — Telehealth: Payer: BLUE CROSS/BLUE SHIELD | Admitting: Physician Assistant

## 2018-12-30 DIAGNOSIS — J029 Acute pharyngitis, unspecified: Secondary | ICD-10-CM | POA: Diagnosis not present

## 2018-12-30 DIAGNOSIS — J069 Acute upper respiratory infection, unspecified: Secondary | ICD-10-CM | POA: Diagnosis not present

## 2018-12-30 DIAGNOSIS — M791 Myalgia, unspecified site: Secondary | ICD-10-CM | POA: Diagnosis not present

## 2018-12-30 DIAGNOSIS — Z20828 Contact with and (suspected) exposure to other viral communicable diseases: Secondary | ICD-10-CM | POA: Diagnosis not present

## 2018-12-30 NOTE — Progress Notes (Signed)
E-Visit for Corona Virus Screening   Your current symptoms could be consistent with the coronavirus.  Call your health care provider or local health department to request and arrange formal testing. Many health care providers can now test patients at their office but not all are.  Please quarantine yourself while awaiting your test results.  [place] [phone], [place], [place] [phone] or visit [email or web address]  and You have been enrolled in [place] for COVID-19.  Daily you will receive a questionnaire within the Outlook website. Our COVID-19 response team will be monitoring your responses daily.    COVID-19 is a respiratory illness with symptoms that aresimilar to the flu. Symptoms are typically mild to moderate, but there have been cases of severe illness and death due to the virus. The following symptoms may appear 2-14 days after exposure: Fever Cough Shortness of breath or difficulty breathing Chills Repeated shaking with chills Muscle pain Headache Sore throat New loss of taste or smell Fatigue Congestion or runny nose Nausea or vomiting Diarrhea  It is vitally important that if you feel that you have an infection such as this virus or any other virus that you stay home and away from places where you may spread it to others.  You should self-quarantine for 14 days if you have symptoms that could potentially be coronavirus or have been in close contact a with a person diagnosed with COVID-19 within the last 2 weeks. You should avoid contact with people age 67 and older.   You should wear a mask or cloth face covering over your nose and mouth if you must be around other people or animals, including pets (even at home). Try to stay at least 6 feet away from other people. This will protect the people around  you.    You may also take acetaminophen (Tylenol) as needed for fever.   Reduce your risk of any infection by using the same precautions used for avoiding the common cold or flu: Wash your hands often with soap and warm water for at least 20 seconds.  If soap and water are not readily available, use an alcohol-based hand sanitizer with at least 60% alcohol.  If coughing or sneezing, cover your mouth and nose by coughing or sneezing into the elbow areas of your shirt or coat, into a tissue or into your sleeve (not your hands). Avoid shaking hands with others and consider head nods or verbal greetings only.  Avoid touching your eyes,nose, or mouth with unwashed hands. Avoid close contact with people who are sick. Avoid places or events with large numbers of people in one location, like concerts or sporting events. Carefully consider travel plans you have or are making. If you are planning any travel outside or inside the [place], visit the CDC'sTravelers' Health webpagefor the latest health notices. If you have some symptoms but not all symptoms, continue to monitor at home and seek medical attention if your symptoms worsen. If you are having a medical emergency, call 911.  HOME CARE Only take medications as instructed by your medical team. Drink plenty of fluids and get plenty of rest. A steam or ultrasonic humidifier can help if you have congestion.   GET HELP RIGHT AWAY IF YOU HAVE EMERGENCY WARNING SIGNS** FOR COVID-19. If you or someone is showing any of these signs seek emergency medical care immediately. Call 911 or proceed to your closest emergency facility if: You develop worsening high fever. Trouble  breathing Bluish lips or face Persistent pain or pressure in the chest New confusion Inability to wake or stay awake You cough up blood. Your symptoms become more severe  **This list is not all possible symptoms. Contact your medical provider for any symptoms that are sever or  concerning to you.   MAKE SURE YOU  Understand these instructions. Will watch your condition. Will get help right away if you are not doing well or get worse.  Your e-visit answers were reviewed by a board certified advanced clinical practitioner to complete your personal care plan.  Depending on the condition, your plan could have included both over the counter or prescription medications.  If there is a problem please reply once you have received a response from your provider.  Your safety is important to [place].  If you have drug allergies check your prescription carefully.    You can use MyChart to ask questions about today's visit, request a non-urgent call back, or ask for a work or school excuse for 24 hours related to this e-Visit. If it has been greater than 24 hours you will need to follow up with your provider, or enter a new e-Visit to address those concerns. You will get an e-mail in the next two days asking about your experience.  I hope that your e-visit has been valuable and will speed your recovery. Thank you for using e-visits.    ===View-only below this line===   ----- Message -----    From: Gypsy Lore    Sent: 12/30/2018  3:41 PM EDT      To: E-Visit Mailing List Subject: Flu Like Symptoms  Flu Like Symptoms --------------------------------  Question: How long have you had flu like symptoms? Answer:   less than 48 hours  Question: Are you having any body aches or pain such as Answer:   Muscle pain  Question: Do you have a cough or sore throat? Answer:   I have a sore throat  Question: Are you in close contact with anyone who has similar symptoms ? Answer:   No  Question: Do you have a fever? Answer:   No, I do not have a fever  Question: Describe your sore throat: Answer:   Just starting like a tickle in the back of my throat  Question: How long have you had a sore throat? Answer:   1 day  Question: Do you have any tenderness or swelling in your  neck? Answer:   No  Question: Are you short of breath? Answer:   No  Question: Do you have constant pain in your chest or abdomen? Answer:   No  Question: Are you able to keep liquids down? Answer:   Yes  Question: Have you experienced a seizure or loss of consciousness? Answer:   No  Question: Do you have a headache? Answer:   Yes  Question: Is there a rash? Answer:   No  Question: Are you over the age of 62? Answer:   No  Question: Are you treated for any of the following conditions: Asthma, COPD, diabetes, renal failure on dialysis, AIDS, any neuromuscular disease that effects the clearing of secretions heart failure or heart disease? Answer:   No  Question: Have you received recent chemotherapy or are you taking a drug that may reduce your ability to fight infections for psoriasis, arthritis, or any other autoimmune condition? Answer:   No  Question: Do you have close contact with anyone who has been diagnosed with any of  the conditions listed above? Answer:   No  Question: Are your symptoms severe enough that you think or someone has told you that you need to see a physician urgently? Answer:   No  Question: Are you allergic to Zanamivir or Oseltamivir (Tamiflu)? Answer:   No  Question: Are there children in your family or household that are under the age of 79? Answer:   No  Question: Please list your medication allergies that you may have ? (If 'none' , please list as 'none') Answer:   Penacylin            Bee venum  Question: Please list any additional comments  Answer:     A total of 5-10 minutes was spent evaluating this patients questionnaire and formulating a plan of care.

## 2018-12-31 ENCOUNTER — Ambulatory Visit: Payer: BC Managed Care – PPO | Admitting: Physician Assistant

## 2019-01-10 ENCOUNTER — Encounter: Payer: Self-pay | Admitting: Physician Assistant

## 2019-01-10 ENCOUNTER — Encounter (INDEPENDENT_AMBULATORY_CARE_PROVIDER_SITE_OTHER): Payer: Self-pay

## 2019-01-11 ENCOUNTER — Encounter (INDEPENDENT_AMBULATORY_CARE_PROVIDER_SITE_OTHER): Payer: Self-pay

## 2019-01-12 ENCOUNTER — Encounter (INDEPENDENT_AMBULATORY_CARE_PROVIDER_SITE_OTHER): Payer: Self-pay

## 2019-01-12 ENCOUNTER — Other Ambulatory Visit: Payer: Self-pay

## 2019-01-12 ENCOUNTER — Telehealth: Payer: Self-pay

## 2019-01-12 DIAGNOSIS — Z20822 Contact with and (suspected) exposure to covid-19: Secondary | ICD-10-CM

## 2019-01-12 NOTE — Telephone Encounter (Signed)
Patient is calling that he was exposed to someone that tested Positive for covid-19 Reports that he went to and urgent care and his test was negative. Reports that his symptoms are worsening to the point that his cough is do dry that he is not able to make it to the bathroom. He also called yesterday and was advised to call our office today so Tawanna Sat can put the referral in for him to get tested and was told that he will be call back with the appointment.  Patient was advised to go to [facility] center at [facility]

## 2019-01-13 ENCOUNTER — Telehealth (INDEPENDENT_AMBULATORY_CARE_PROVIDER_SITE_OTHER): Payer: BC Managed Care – PPO | Admitting: Physician Assistant

## 2019-01-13 ENCOUNTER — Encounter: Payer: Self-pay | Admitting: Physician Assistant

## 2019-01-13 ENCOUNTER — Other Ambulatory Visit: Payer: Self-pay | Admitting: Physician Assistant

## 2019-01-13 VITALS — BP 105/80 | Wt 263.0 lb

## 2019-01-13 DIAGNOSIS — R0602 Shortness of breath: Secondary | ICD-10-CM

## 2019-01-13 DIAGNOSIS — R05 Cough: Secondary | ICD-10-CM

## 2019-01-13 DIAGNOSIS — R059 Cough, unspecified: Secondary | ICD-10-CM

## 2019-01-13 DIAGNOSIS — M25532 Pain in left wrist: Secondary | ICD-10-CM

## 2019-01-13 MED ORDER — OXYCODONE HCL 5 MG PO TABS: 5.0000 mg | ORAL_TABLET | Freq: Three times a day (TID) | ORAL | 0 refills | Status: DC | PRN

## 2019-01-13 MED ORDER — ALBUTEROL SULFATE HFA 108 (90 BASE) MCG/ACT IN AERS: 2.0000 | INHALATION_SPRAY | Freq: Four times a day (QID) | RESPIRATORY_TRACT | 0 refills | Status: DC | PRN

## 2019-01-13 MED ORDER — BENZONATATE 200 MG PO CAPS: 200.0000 mg | ORAL_CAPSULE | Freq: Three times a day (TID) | ORAL | 0 refills | Status: DC | PRN

## 2019-01-13 NOTE — Progress Notes (Signed)
Patient: Francisco Oneal Male    DOB: [birth year]   48 y.o.   MRN: [id number] Visit Date: 01/13/2019  Today's Provider: Mar Daring, PA-C   Chief Complaint  Patient presents with  . Cough   Subjective:    I,Joseline E. Rosas,RMA am acting as a Education administrator for [employer], PA-C.  Virtual Visit via Video Note  I connected with Gypsy Lore on 01/13/19 at  2:20 PM EDT by a video enabled telemedicine application and verified that I am speaking with the correct person using two identifiers.  Location: Patient: home Provider: [facility]   I discussed the limitations of evaluation and management by telemedicine and the availability of in person appointments. The patient expressed understanding and agreed to proceed.   HPI  Patient with c/o cough,SOB, headache,chills. He was in a meeting with co-worker who tested positive about 2 weeks ago. He has been tested once and was Covid negative. Patient was also tested for COVID yesterday due to worsening symptoms, results pending. Reports he had symptoms of fatigue and skin sensitivity initially. He was seen by [facility] clinic and put out of work for three days last week. He reports he started feeling better by Wednesday last week, attended his son's wedding and festivities (in a mask) on Friday and Saturday. Then by Sunday developed the SOB and cough with fatigue and chest heaviness. He reports Monday was the worst day. Today he reports feeling some better, but still with cough and chest heaviness.   Allergies  Allergen Reactions  . Bee Venom Anaphylaxis  . Penicillins Other (See Comments)    Has patient had a PCN reaction causing immediate rash, facial/tongue/throat swelling, SOB or lightheadedness with hypotension: Yes Has patient had a PCN reaction causing severe rash involving mucus membranes or skin necrosis: Yes Has patient had a PCN reaction that required hospitalization: Yes Has patient had a PCN reaction occurring within the  last 10 years: No If all of the above answers are "NO", then may proceed with Cephalosporin use.      Current Outpatient Medications:  .  ALPRAZolam (XANAX) 0.5 MG tablet, TAKE ONE-HALF TO ONE TABLET BY MOUTH TWICE DAILY AS NEEDED FOR ANXIETY, Disp: 180 tablet, Rfl: 1 .  buPROPion (WELLBUTRIN SR) 150 MG 12 hr tablet, Take 1 tablet (150 mg total) by mouth 2 (two) times daily., Disp: 180 tablet, Rfl: 1 .  methocarbamol (ROBAXIN) 500 MG tablet, TAKE ONE TABLET BY MOUTH EVERY EIGHT HOURS AS NEEDED FOR MUSCLE SPASMS, Disp: 90 tablet, Rfl: 1 .  oxyCODONE (OXY IR/ROXICODONE) 5 MG immediate release tablet, Take 1 tablet (5 mg total) by mouth every 8 (eight) hours as needed for severe pain., Disp: 60 tablet, Rfl: 0 .  pantoprazole (PROTONIX) 40 MG tablet, Take 1 tablet (40 mg total) by mouth daily., Disp: 90 tablet, Rfl: 3 .  VITAMIN D PO, Take by mouth., Disp: , Rfl:  .  Vitamin D, Ergocalciferol, (DRISDOL) 1.25 MG (50000 UT) CAPS capsule, Take 1 capsule (50,000 Units total) by mouth every 7 (seven) days. (Patient not taking: Reported on 01/13/2019), Disp: 12 capsule, Rfl: 0  Review of Systems  Constitutional: Positive for activity change, appetite change, chills, diaphoresis and fatigue. Negative for fever.  HENT: Positive for congestion. Negative for postnasal drip, rhinorrhea, sinus pressure and sinus pain.   Respiratory: Positive for cough, chest tightness and shortness of breath. Negative for wheezing.   Cardiovascular: Negative for chest pain, palpitations and leg swelling.  Gastrointestinal: Negative  for abdominal pain.  Musculoskeletal: Positive for back pain.  Neurological: Positive for weakness.    Social History   Tobacco Use  . Smoking status: Never Smoker  . Smokeless tobacco: Never Used  Substance Use Topics  . Alcohol use: Yes    Alcohol/week: 8.0 standard drinks    Types: 8 Glasses of wine per week      Objective:   BP 105/80 (BP Location: Right Arm, Patient Position:  Sitting, Cuff Size: Large)   Wt 263 lb (119.3 kg)   SpO2 94% Comment: 94-90%  BMI 35.67 kg/m  Vitals:   01/13/19 1415  BP: 105/80  SpO2: 94%  Weight: 263 lb (119.3 kg)     Physical Exam   No results found for any visits on 01/13/19.     Assessment & Plan     1. Cough Patient requiring FMLA for time off. Advised we will complete paperwork since he will be out of work this week due to symptoms. Next week will be pending. I will send tessalon perles and albuterol inhaler for cough and SOB. Call if worsening. We will f/u pending Covid results.  - benzonatate (TESSALON) 200 MG capsule; Take 1 capsule (200 mg total) by mouth 3 (three) times daily as needed for cough.  Dispense: 30 capsule; Refill: 0  2. SOB (shortness of breath) See above medical treatment plan. - albuterol (VENTOLIN HFA) 108 (90 Base) MCG/ACT inhaler; Inhale 2 puffs into the lungs every 6 (six) hours as needed for wheezing or shortness of breath.  Dispense: 18 g; Refill: 0  I discussed the assessment and treatment plan with the patient. The patient was provided an opportunity to ask questions and all were answered. The patient agreed with the plan and demonstrated an understanding of the instructions.   The patient was advised to call back or seek an in-person evaluation if the symptoms worsen or if the condition fails to improve as anticipated.  I provided 15 minutes of non-face-to-face time during this encounter.    Mar Daring, PA-C  [facility]

## 2019-01-14 ENCOUNTER — Telehealth: Payer: Self-pay | Admitting: Hematology

## 2019-01-14 ENCOUNTER — Encounter: Payer: Self-pay | Admitting: Physician Assistant

## 2019-01-14 NOTE — Telephone Encounter (Signed)
Patient has already had test completed.

## 2019-01-16 ENCOUNTER — Other Ambulatory Visit: Payer: Self-pay | Admitting: Physician Assistant

## 2019-01-16 DIAGNOSIS — R05 Cough: Secondary | ICD-10-CM

## 2019-01-16 DIAGNOSIS — R059 Cough, unspecified: Secondary | ICD-10-CM

## 2019-01-16 DIAGNOSIS — R0602 Shortness of breath: Secondary | ICD-10-CM

## 2019-01-16 MED ORDER — ALBUTEROL SULFATE HFA 108 (90 BASE) MCG/ACT IN AERS: 1.0000 | INHALATION_SPRAY | Freq: Four times a day (QID) | RESPIRATORY_TRACT | 0 refills | Status: DC | PRN

## 2019-01-16 NOTE — Progress Notes (Signed)
Sent in Ventolin branded inhaler as PA for albuterol stated ventolin should be covered

## 2019-01-17 LAB — NOVEL CORONAVIRUS, NAA: SARS-CoV-2, NAA: NOT DETECTED

## 2019-01-22 ENCOUNTER — Other Ambulatory Visit: Payer: Self-pay | Admitting: Physician Assistant

## 2019-01-22 DIAGNOSIS — R05 Cough: Secondary | ICD-10-CM

## 2019-01-22 DIAGNOSIS — R059 Cough, unspecified: Secondary | ICD-10-CM

## 2019-01-23 MED ORDER — BENZONATATE 200 MG PO CAPS: 200.0000 mg | ORAL_CAPSULE | Freq: Three times a day (TID) | ORAL | 0 refills | Status: DC | PRN

## 2019-01-28 ENCOUNTER — Other Ambulatory Visit: Payer: Self-pay | Admitting: Physician Assistant

## 2019-01-28 DIAGNOSIS — M62838 Other muscle spasm: Secondary | ICD-10-CM

## 2019-02-11 ENCOUNTER — Other Ambulatory Visit: Payer: Self-pay | Admitting: Physician Assistant

## 2019-02-11 DIAGNOSIS — R059 Cough, unspecified: Secondary | ICD-10-CM

## 2019-02-11 DIAGNOSIS — M25532 Pain in left wrist: Secondary | ICD-10-CM

## 2019-02-11 DIAGNOSIS — R05 Cough: Secondary | ICD-10-CM

## 2019-02-12 MED ORDER — BENZONATATE 200 MG PO CAPS: 200.0000 mg | ORAL_CAPSULE | Freq: Three times a day (TID) | ORAL | 0 refills | Status: DC | PRN

## 2019-02-12 MED ORDER — OXYCODONE HCL 5 MG PO TABS: 5.0000 mg | ORAL_TABLET | Freq: Three times a day (TID) | ORAL | 0 refills | Status: DC | PRN

## 2019-02-25 ENCOUNTER — Other Ambulatory Visit: Payer: Self-pay | Admitting: Physician Assistant

## 2019-02-25 ENCOUNTER — Encounter: Payer: Self-pay | Admitting: Physician Assistant

## 2019-02-25 DIAGNOSIS — E559 Vitamin D deficiency, unspecified: Secondary | ICD-10-CM

## 2019-02-25 MED ORDER — VITAMIN D (ERGOCALCIFEROL) 1.25 MG (50000 UNIT) PO CAPS: 50000.0000 [IU] | ORAL_CAPSULE | ORAL | 0 refills | Status: DC

## 2019-02-25 MED ORDER — PHENTERMINE HCL 37.5 MG PO TABS: 37.5000 mg | ORAL_TABLET | Freq: Every day | ORAL | 2 refills | Status: DC

## 2019-03-16 ENCOUNTER — Other Ambulatory Visit: Payer: Self-pay | Admitting: Physician Assistant

## 2019-03-16 DIAGNOSIS — M62838 Other muscle spasm: Secondary | ICD-10-CM

## 2019-03-18 ENCOUNTER — Other Ambulatory Visit: Payer: Self-pay | Admitting: Physician Assistant

## 2019-03-18 DIAGNOSIS — M25532 Pain in left wrist: Secondary | ICD-10-CM

## 2019-03-18 MED ORDER — OXYCODONE HCL 5 MG PO TABS: 5.0000 mg | ORAL_TABLET | Freq: Three times a day (TID) | ORAL | 0 refills | Status: DC | PRN

## 2019-04-12 ENCOUNTER — Other Ambulatory Visit: Payer: Self-pay | Admitting: Physician Assistant

## 2019-04-12 DIAGNOSIS — M25532 Pain in left wrist: Secondary | ICD-10-CM

## 2019-04-12 DIAGNOSIS — E559 Vitamin D deficiency, unspecified: Secondary | ICD-10-CM

## 2019-04-14 MED ORDER — OXYCODONE HCL 5 MG PO TABS: 5.0000 mg | ORAL_TABLET | Freq: Three times a day (TID) | ORAL | 0 refills | Status: DC | PRN

## 2019-04-14 MED ORDER — VITAMIN D (ERGOCALCIFEROL) 1.25 MG (50000 UNIT) PO CAPS: 50000.0000 [IU] | ORAL_CAPSULE | ORAL | 0 refills | Status: DC

## 2019-05-04 ENCOUNTER — Other Ambulatory Visit: Payer: Self-pay | Admitting: Physician Assistant

## 2019-05-04 DIAGNOSIS — M62838 Other muscle spasm: Secondary | ICD-10-CM

## 2019-05-14 ENCOUNTER — Other Ambulatory Visit: Payer: Self-pay | Admitting: Physician Assistant

## 2019-05-14 DIAGNOSIS — M25532 Pain in left wrist: Secondary | ICD-10-CM

## 2019-05-14 MED ORDER — OXYCODONE HCL 5 MG PO TABS: 5.0000 mg | ORAL_TABLET | Freq: Three times a day (TID) | ORAL | 0 refills | Status: DC | PRN

## 2019-06-01 IMAGING — US US ABDOMEN LIMITED
1 series · 14 of 25 positions shown · non-contrast
Comparison: CT abdomen and pelvis December 17, 2016

CLINICAL DATA: Upper abdominal pain with elevated liver enzymes

EXAM:
ULTRASOUND ABDOMEN LIMITED RIGHT UPPER QUADRANT

[Series 1: us abdomen limited · 0.27mm/px · 14 of 46 slices shown]
[im 1/46]
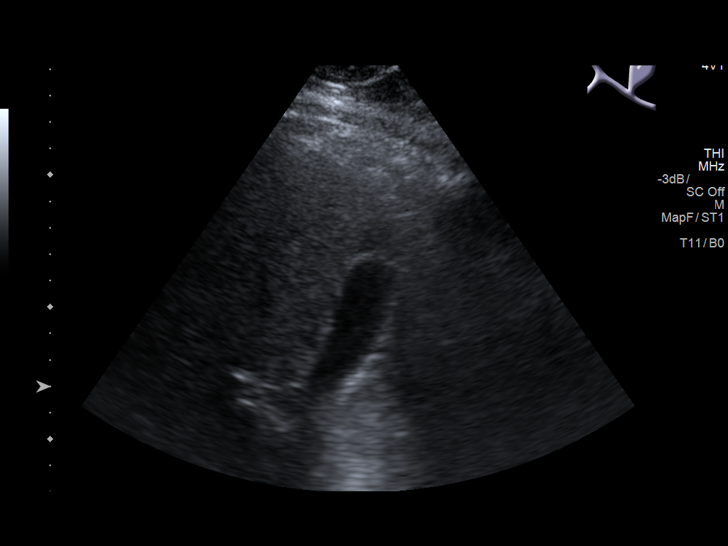
[im 4/46]
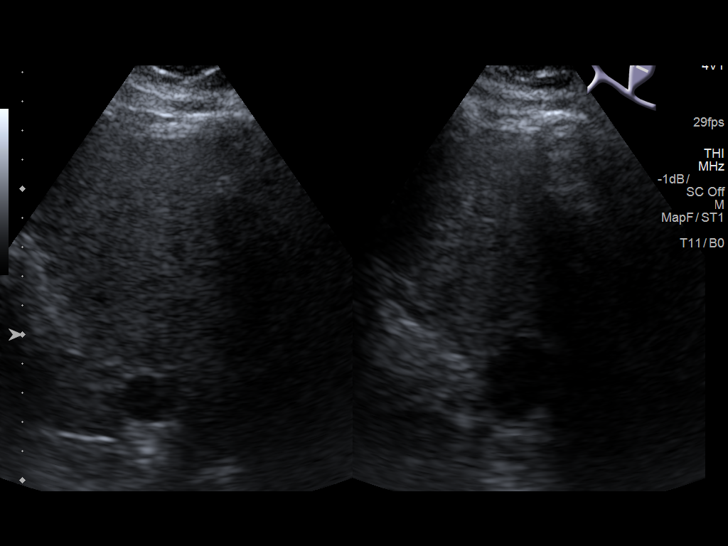
[im 8/46]
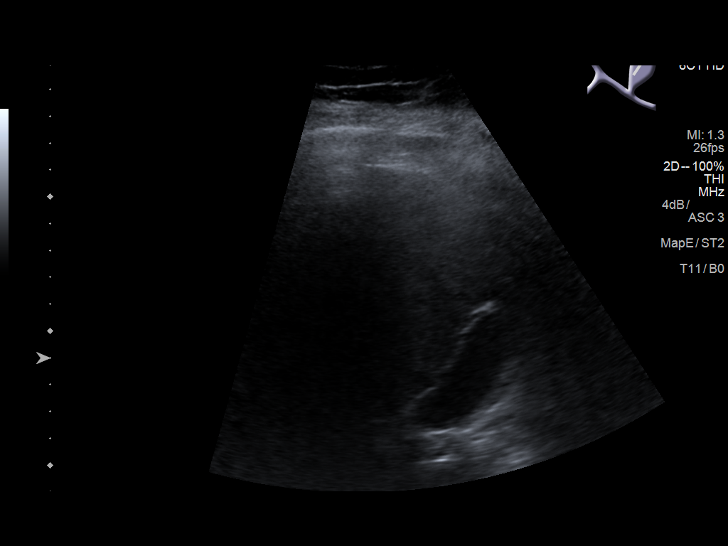
[im 12/46]
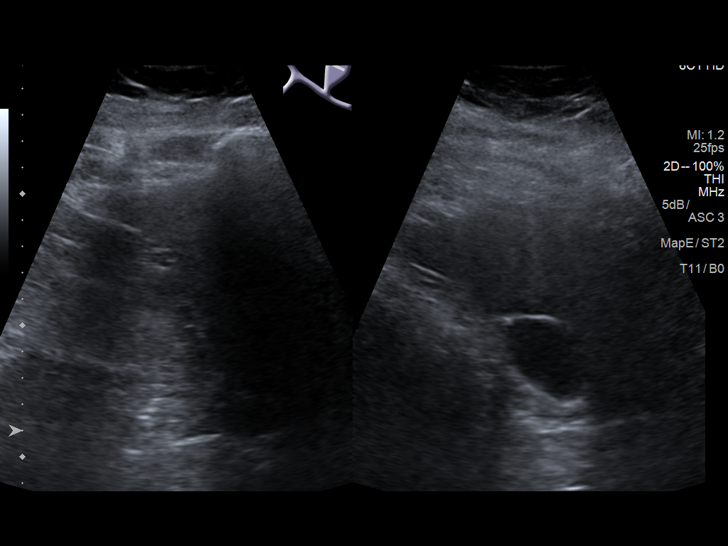
[im 16/46]
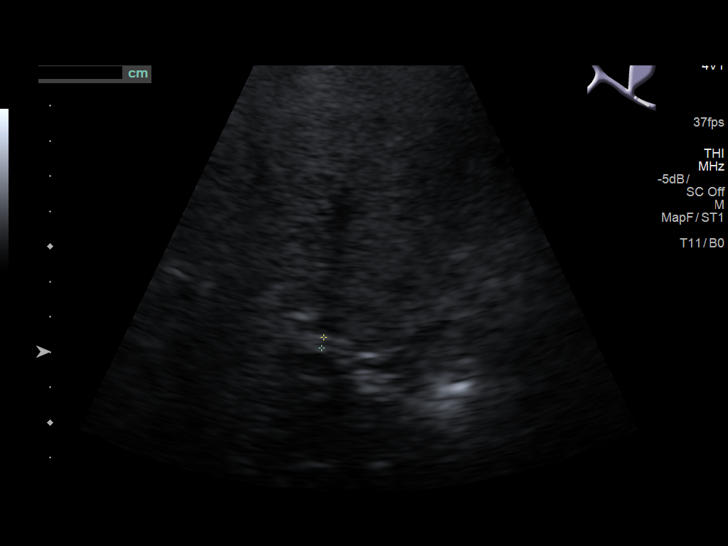
[im 17/46]
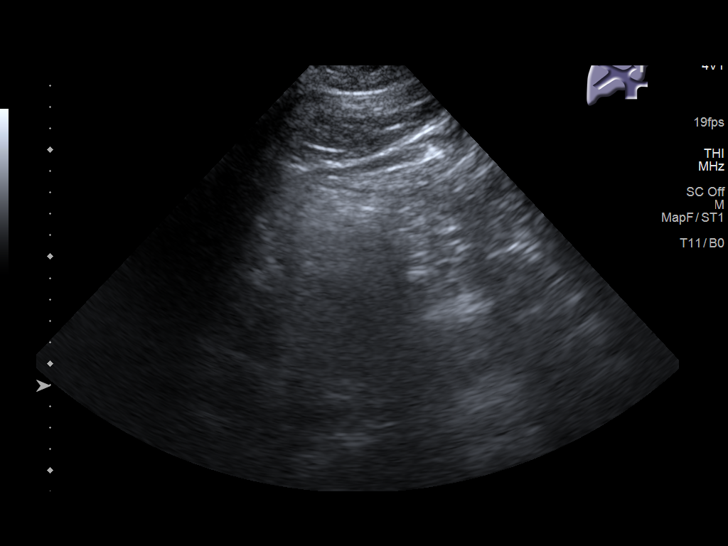
[im 21/46]
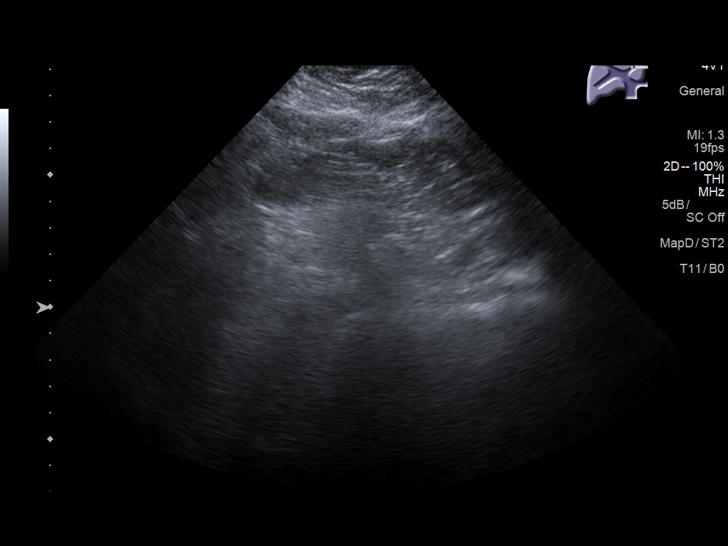
[im 25/46]
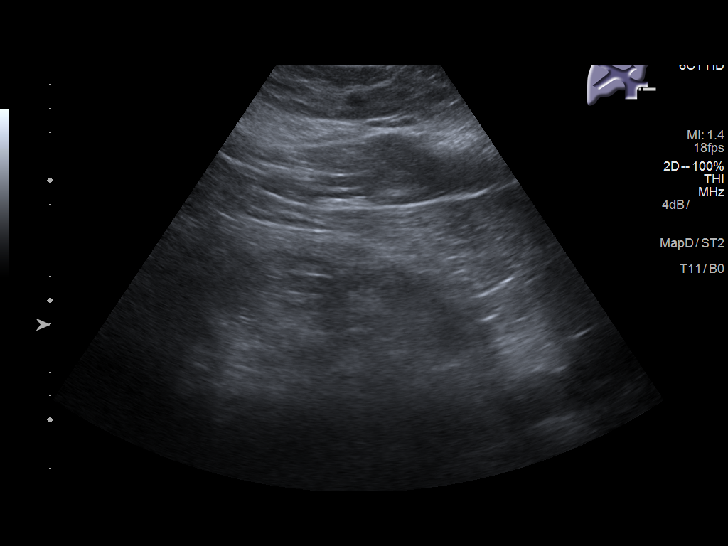
[im 29/46]
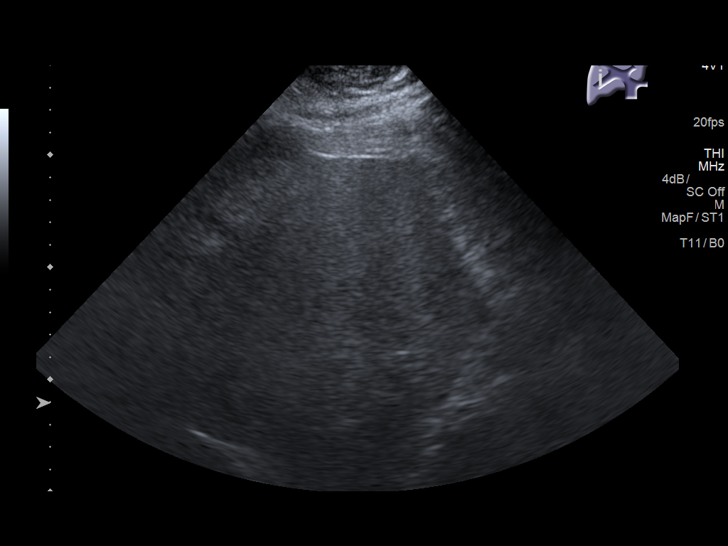
[im 31/46]
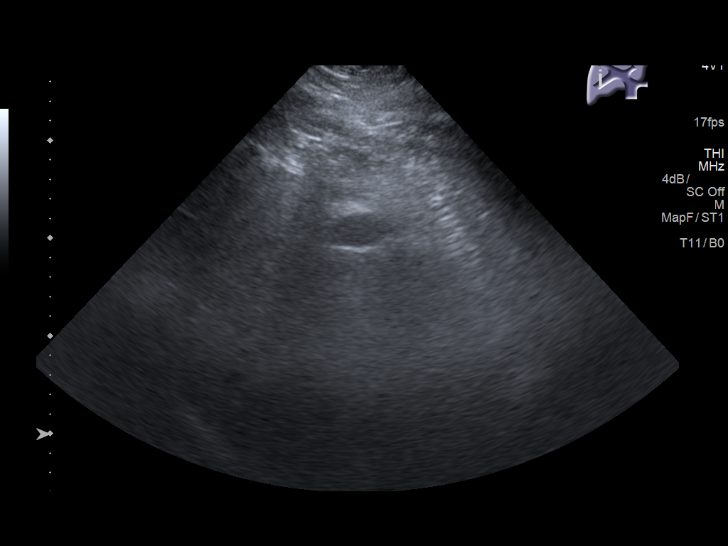
[im 34/46]
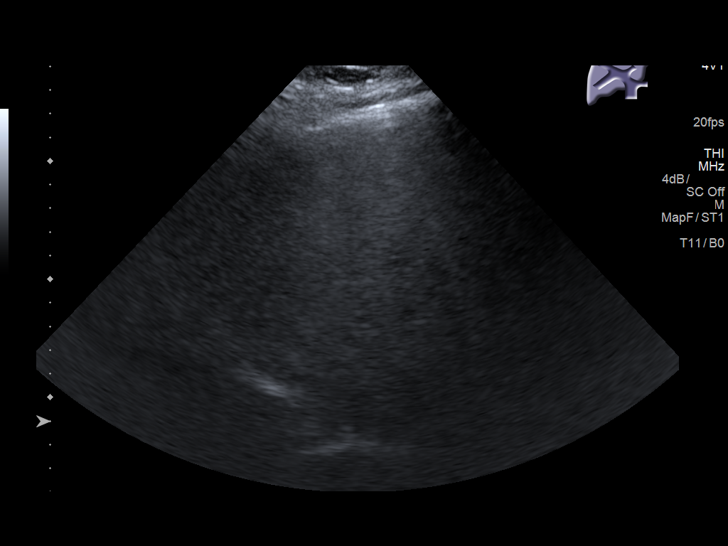
[im 38/46]
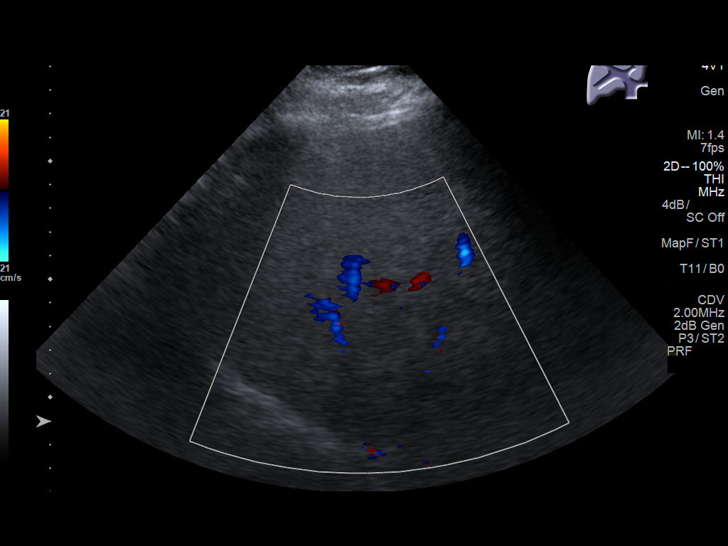
[im 42/46]
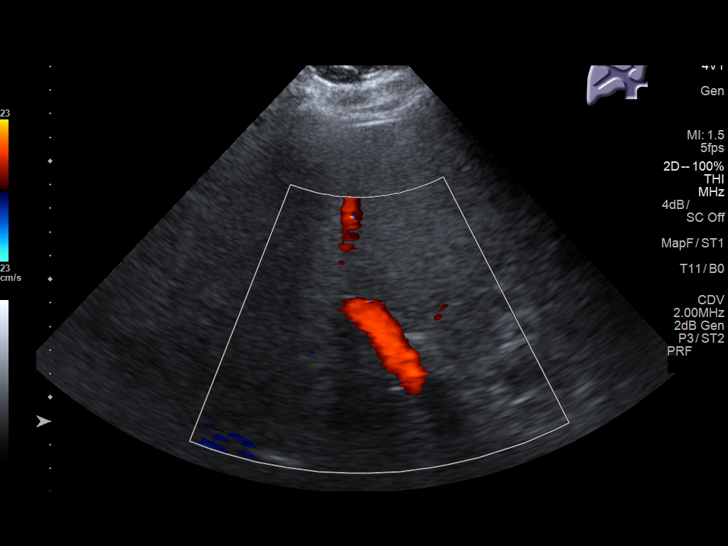
[im 46/46]
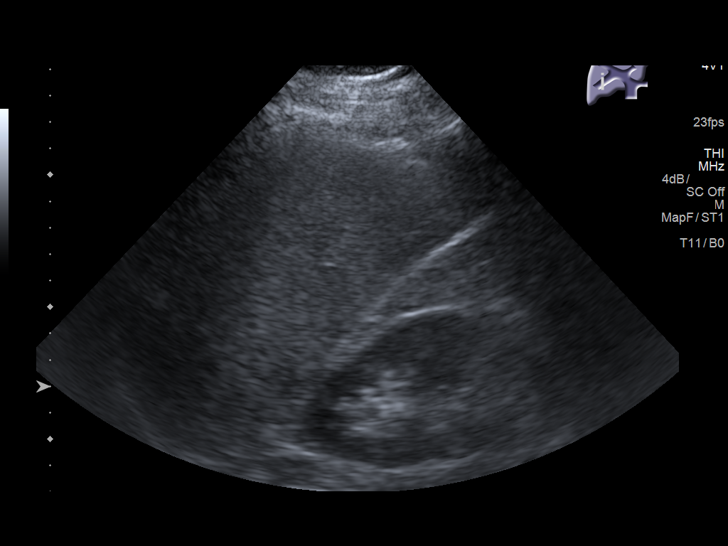

[14 of 25 positions shown; findings below may reference images not displayed]

FINDINGS: Gallbladder:

No gallstones or wall thickening visualized. There is no
pericholecystic fluid. No sonographic Murphy sign noted by
sonographer.

Common bile duct:

Diameter: 3 mm. No intrahepatic or extrahepatic biliary duct
dilatation.

Liver:

No focal lesion identified. Liver echogenicity is increased
diffusely.. Portal vein is patent on color Doppler imaging with
normal direction of blood flow towards the liver.
IMPRESSION: Diffuse increase in liver echogenicity, a finding felt to be
indicative of hepatic steatosis. While no focal liver lesions are
evident on this study, it must be cautioned that the sensitivity of
ultrasound for detection of focal liver lesions is diminished in
this circumstance.

Study otherwise unremarkable.

## 2019-06-07 ENCOUNTER — Other Ambulatory Visit: Payer: Self-pay | Admitting: Family Medicine

## 2019-06-07 ENCOUNTER — Other Ambulatory Visit: Payer: Self-pay | Admitting: Physician Assistant

## 2019-06-07 DIAGNOSIS — F411 Generalized anxiety disorder: Secondary | ICD-10-CM

## 2019-06-07 DIAGNOSIS — M25532 Pain in left wrist: Secondary | ICD-10-CM

## 2019-06-08 MED ORDER — OXYCODONE HCL 5 MG PO TABS: 5.0000 mg | ORAL_TABLET | Freq: Three times a day (TID) | ORAL | 0 refills | Status: DC | PRN

## 2019-06-10 ENCOUNTER — Encounter: Payer: Self-pay | Admitting: Physician Assistant

## 2019-06-11 ENCOUNTER — Other Ambulatory Visit: Payer: Self-pay | Admitting: Physician Assistant

## 2019-06-11 DIAGNOSIS — Z6837 Body mass index (BMI) 37.0-37.9, adult: Secondary | ICD-10-CM

## 2019-06-11 NOTE — Telephone Encounter (Signed)
Requested medication (s) are due for refill today: yes  Requested medication (s) are on the active medication list: yes  Last refill:  04/29/19  Future visit scheduled: no  Notes to clinic:  Refill cannot be delegated    Requested Prescriptions  Pending Prescriptions Disp Refills   phentermine (ADIPEX-P) 37.5 MG tablet [Pharmacy Med Name: PHENTERMINE 37.5 MG TABLET] 30 tablet 2    Sig: TAKE 1 TABLET BY MOUTH DAILY BEFORE BREAKFAST     Not Delegated - Gastroenterology:  Antiobesity Agents Failed - 06/11/2019 12:21 PM      Failed - This refill cannot be delegated      Passed - Last BP in normal range    BP Readings from Last 1 Encounters:  01/13/19 105/80         Passed - Last Heart Rate in normal range    Pulse Readings from Last 1 Encounters:  09/08/18 (!) 106         Passed - Valid encounter within last 12 months    Recent Outpatient Visits          4 months ago Cough   [facility] [place], Anderson Malta M, PA-C   6 months ago Class 2 severe obesity due to excess calories with serious comorbidity and body mass index (BMI) of 37.0 to 37.9 in adult [facility])   [facility] [place], [place], PA-C   9 months ago Class 3 severe obesity due to excess calories with serious comorbidity and body mass index (BMI) of 40.0 to 44.9 in adult [facility])   [facility] [place], Clearnce Sor[place], [place]   12 months ago Annual physical exam   [facility] [place], [place], [place]   3 years ago Diarrhea, unspecified type   [facility] [place], [place], [place]

## 2019-06-24 ENCOUNTER — Other Ambulatory Visit: Payer: Self-pay | Admitting: Physician Assistant

## 2019-06-24 DIAGNOSIS — K219 Gastro-esophageal reflux disease without esophagitis: Secondary | ICD-10-CM

## 2019-06-24 DIAGNOSIS — F419 Anxiety disorder, unspecified: Secondary | ICD-10-CM

## 2019-06-24 NOTE — Telephone Encounter (Signed)
Requested medication (s) are due for refill today: yes  Requested medication (s) are on the active medication list: yes  Last refill:  03/29/2019  Future visit scheduled:no  Notes to clinic:  not delegated    Requested Prescriptions  Pending Prescriptions Disp Refills   ALPRAZolam (XANAX) 0.5 MG tablet [Pharmacy Med Name: ALPRAZOLAM 0.5MG TABLETS] 180 tablet     Sig: TAKE ONE-HALF TO ONE TABLET BY MOUTH TWICE DAILY AS NEEDED FOR ANXIETY      Not Delegated - Psychiatry:  Anxiolytics/Hypnotics Failed - 06/24/2019 10:44 AM      Failed - This refill cannot be delegated      Failed - Urine Drug Screen completed in last 360 days.      Passed - Valid encounter within last 6 months    Recent Outpatient Visits           5 months ago Cough   [facility] Fenton Malling M, [place]   6 months ago Class 2 severe obesity due to excess calories with serious comorbidity and body mass index (BMI) of 37.0 to 37.9 in adult [facility])   [place], Anderson Malta M, [place]   9 months ago Class 3 severe obesity due to excess calories with serious comorbidity and body mass index (BMI) of 40.0 to 44.9 in adult [facility])   [place], Clearnce Sorrel, [place]   1 year ago Annual physical exam   [facility] Fenton Malling M, [place]   3 years ago Diarrhea, unspecified type   [facility], [place], [place]

## 2019-06-24 NOTE — Telephone Encounter (Signed)
Requested Prescriptions  Pending Prescriptions Disp Refills  . pantoprazole (PROTONIX) 40 MG tablet [Pharmacy Med Name: PANTOPRAZOLE 40MG TABLETS] 90 tablet 3    Sig: TAKE 1 TABLET BY MOUTH DAILY. [place]     Gastroenterology: Proton Pump Inhibitors Passed - 06/24/2019  2:21 PM      Passed - Valid encounter within last 12 months    Recent Outpatient Visits          5 months ago Cough   [facility] Fenton Malling M, PA-C   6 months ago Class 2 severe obesity due to excess calories with serious comorbidity and body mass index (BMI) of 37.0 to 37.9 in adult [facility])   [facility], Anderson Malta M, [place]   9 months ago Class 3 severe obesity due to excess calories with serious comorbidity and body mass index (BMI) of 40.0 to 44.9 in adult [facility])   [place], Clearnce Sorrel, [place]   1 year ago Annual physical exam   [facility] Fenton Malling M, [place]   3 years ago Diarrhea, unspecified type   [facility], [place], [place]

## 2019-06-26 DIAGNOSIS — Z20828 Contact with and (suspected) exposure to other viral communicable diseases: Secondary | ICD-10-CM | POA: Diagnosis not present

## 2019-06-29 ENCOUNTER — Other Ambulatory Visit: Payer: Self-pay | Admitting: Physician Assistant

## 2019-06-29 DIAGNOSIS — K219 Gastro-esophageal reflux disease without esophagitis: Secondary | ICD-10-CM

## 2019-06-29 DIAGNOSIS — M25532 Pain in left wrist: Secondary | ICD-10-CM

## 2019-06-30 MED ORDER — OXYCODONE HCL 5 MG PO TABS: 5.0000 mg | ORAL_TABLET | Freq: Three times a day (TID) | ORAL | 0 refills | Status: DC | PRN

## 2019-07-21 ENCOUNTER — Other Ambulatory Visit: Payer: Self-pay | Admitting: Physician Assistant

## 2019-07-21 DIAGNOSIS — E559 Vitamin D deficiency, unspecified: Secondary | ICD-10-CM

## 2019-07-21 NOTE — Telephone Encounter (Signed)
Requested medication (s) are due for refill today: yes  Requested medication (s) are on the active medication list: yes  Last refill:  04/29/19  Future visit scheduled: no  Notes to clinic:  not delegated    Requested Prescriptions  Pending Prescriptions Disp Refills   Vitamin D, Ergocalciferol, (DRISDOL) 1.25 MG (50000 UNIT) CAPS capsule [Pharmacy Med Name: VITAMIN D2 1.25MG(50,000 UNIT)] 12 capsule 0    Sig: Take 1 capsule (50,000 Units total) by mouth every 7 (seven) days.      Endocrinology:  Vitamins - Vitamin D Supplementation Failed - 07/21/2019  1:50 AM      Failed - 50,000 IU strengths are not delegated      Failed - Ca in normal range and within 360 days    Calcium  Date Value Ref Range Status  06/10/2018 9.1 8.7 - 10.2 mg/dL Final   Calcium, Total  Date Value Ref Range Status  06/07/2013 9.4 8.5 - 10.1 mg/dL Final          Failed - Phosphate in normal range and within 360 days    No results found for: PHOS        Failed - Vitamin D in normal range and within 360 days    Vit D, 25-Hydroxy  Date Value Ref Range Status  06/18/2018 17.4 (L) 30.0 - 100.0 ng/mL Final    Comment:    Vitamin D deficiency has been defined by the [place] practice guideline as a level of serum 25-OH vitamin D less than 20 ng/mL (1,2). The Endocrine Society went on to further define vitamin D insufficiency as a level between 21 and 29 ng/mL (2). 1. IOM (Institute of Medicine). 2010. Dietary reference    intakes for calcium and D. [place]: The    [employer]. 2. Holick MF, Binkley [place], Bischoff-Ferrari HA, et al.    Evaluation, treatment, and prevention of vitamin D    deficiency: an Endocrine Society clinical practice    guideline. JCEM. 2011 Jul; 96(7):1911-30.           Passed - Valid encounter within last 12 months    Recent Outpatient Visits           6 months ago Cough   [facility] Fenton Malling M, [place]   7 months ago Class 2 severe obesity due to excess calories with serious comorbidity and body mass index (BMI) of 37.0 to 37.9 in adult [facility])   [place], Anderson Malta M, [place]   10 months ago Class 3 severe obesity due to excess calories with serious comorbidity and body mass index (BMI) of 40.0 to 44.9 in adult [facility])   [place], Clearnce Sorrel, [place]   1 year ago Annual physical exam   [facility] Fenton Malling M, [place]   3 years ago Diarrhea, unspecified type   [facility], [place], [place]

## 2019-08-03 ENCOUNTER — Other Ambulatory Visit: Payer: Self-pay | Admitting: Physician Assistant

## 2019-08-03 DIAGNOSIS — Z6837 Body mass index (BMI) 37.0-37.9, adult: Secondary | ICD-10-CM

## 2019-08-03 NOTE — Telephone Encounter (Signed)
Requested medication (s) are due for refill today: yes  Requested medication (s) are on the active medication list: yes  Last refill: 06/11/2019  Future visit scheduled: no   Notes to clinic:  not delegated    Requested Prescriptions  Pending Prescriptions Disp Refills   phentermine (ADIPEX-P) 37.5 MG tablet [Pharmacy Med Name: PHENTERMINE 37.5 MG TABLET] 30 tablet 0    Sig: TAKE 1 TABLET BY MOUTH EVERY [place]      Not Delegated - Gastroenterology:  Antiobesity Agents Failed - 08/03/2019 11:35 AM      Failed - This refill cannot be delegated      Passed - Last BP in normal range    BP Readings from Last 1 Encounters:  01/13/19 105/80          Passed - Last Heart Rate in normal range    Pulse Readings from Last 1 Encounters:  09/08/18 (!) 106          Passed - Valid encounter within last 12 months    Recent Outpatient Visits           6 months ago Cough   [facility] [place], Anderson Malta M, PA-C   7 months ago Class 2 severe obesity due to excess calories with serious comorbidity and body mass index (BMI) of 37.0 to 37.9 in adult [facility])   [facility] [place], Anderson Malta M, PA-C   10 months ago Class 3 severe obesity due to excess calories with serious comorbidity and body mass index (BMI) of 40.0 to 44.9 in adult [facility])   [facility] [place], Clearnce Sorrel, [place]   1 year ago Annual physical exam   [facility] Fenton Malling M, [place]   3 years ago Diarrhea, unspecified type   [facility], [place], [place]

## 2019-08-17 ENCOUNTER — Encounter: Payer: Self-pay | Admitting: Physician Assistant

## 2019-08-20 ENCOUNTER — Other Ambulatory Visit: Payer: Self-pay

## 2019-08-20 ENCOUNTER — Encounter: Payer: Self-pay | Admitting: Physician Assistant

## 2019-08-20 ENCOUNTER — Telehealth (INDEPENDENT_AMBULATORY_CARE_PROVIDER_SITE_OTHER): Payer: BC Managed Care – PPO | Admitting: Physician Assistant

## 2019-08-20 VITALS — Wt 273.0 lb

## 2019-08-20 DIAGNOSIS — M25532 Pain in left wrist: Secondary | ICD-10-CM

## 2019-08-20 DIAGNOSIS — E559 Vitamin D deficiency, unspecified: Secondary | ICD-10-CM

## 2019-08-20 DIAGNOSIS — F411 Generalized anxiety disorder: Secondary | ICD-10-CM

## 2019-08-20 DIAGNOSIS — R05 Cough: Secondary | ICD-10-CM

## 2019-08-20 DIAGNOSIS — R059 Cough, unspecified: Secondary | ICD-10-CM

## 2019-08-20 DIAGNOSIS — E661 Drug-induced obesity: Secondary | ICD-10-CM | POA: Insufficient documentation

## 2019-08-20 DIAGNOSIS — R748 Abnormal levels of other serum enzymes: Secondary | ICD-10-CM | POA: Insufficient documentation

## 2019-08-20 DIAGNOSIS — Z125 Encounter for screening for malignant neoplasm of prostate: Secondary | ICD-10-CM

## 2019-08-20 DIAGNOSIS — Z6837 Body mass index (BMI) 37.0-37.9, adult: Secondary | ICD-10-CM

## 2019-08-20 DIAGNOSIS — Z6839 Body mass index (BMI) 39.0-39.9, adult: Secondary | ICD-10-CM | POA: Insufficient documentation

## 2019-08-20 DIAGNOSIS — E7800 Pure hypercholesterolemia, unspecified: Secondary | ICD-10-CM | POA: Insufficient documentation

## 2019-08-20 MED ORDER — BENZONATATE 200 MG PO CAPS: 200.0000 mg | ORAL_CAPSULE | Freq: Three times a day (TID) | ORAL | 0 refills | Status: DC | PRN

## 2019-08-20 MED ORDER — OXYCODONE HCL 5 MG PO TABS: 5.0000 mg | ORAL_TABLET | Freq: Three times a day (TID) | ORAL | 0 refills | Status: DC | PRN

## 2019-08-20 NOTE — Progress Notes (Signed)
Patient: Francisco Oneal Male    DOB: 05/06/1971   49 y.o.   MRN: [id number] Visit Date: 08/20/2019  Today's Provider: Mar Daring, PA-C   No chief complaint on file.  Subjective:     Virtual Visit via Video Note  I connected with Francisco Oneal on 08/20/19 at  7:00 AM EST by a video enabled telemedicine application and verified that I am speaking with the correct person using two identifiers. Interactive audio and video communications were attempted, although failed due to patient's inability to connect to video. Continued visit with audio only interaction with patient agreement.  Location: Patient: Work Provider: [facility]   I discussed the limitations of evaluation and management by telemedicine and the availability of in person appointments. The patient expressed understanding and agreed to proceed.  URI  This is a new problem. The current episode started 1 to 4 weeks ago (1-2 weeks ago). The problem has been gradually improving. There has been no fever. Associated symptoms include congestion, coughing, headaches and rhinorrhea. Pertinent negatives include no abdominal pain, chest pain, diarrhea, ear pain, nausea, neck pain, sinus pain, sneezing, sore throat, swollen glands, vomiting or wheezing. He has tried acetaminophen, increased fluids and sleep for the symptoms. The treatment provided moderate relief.    Also requesting labs for annual work up. Up to date on screenings and vaccinations so just requesting labs.   Allergies  Allergen Reactions  . Bee Venom Anaphylaxis  . Penicillins Other (See Comments)    Has patient had a PCN reaction causing immediate rash, facial/tongue/throat swelling, SOB or lightheadedness with hypotension: Yes Has patient had a PCN reaction causing severe rash involving mucus membranes or skin necrosis: Yes Has patient had a PCN reaction that required hospitalization: Yes Has patient had a PCN reaction occurring within the last 10 years: No If  all of the above answers are "NO", then may proceed with Cephalosporin use.      Current Outpatient Medications:  .  albuterol (VENTOLIN HFA) 108 (90 Base) MCG/ACT inhaler, Inhale 1-2 puffs into the lungs every 6 (six) hours as needed for wheezing or shortness of breath., Disp: 18 g, Rfl: 0 .  ALPRAZolam (XANAX) 0.5 MG tablet, TAKE ONE-HALF TO ONE TABLET BY MOUTH TWICE DAILY AS NEEDED FOR ANXIETY, Disp: 180 tablet, Rfl: 1 .  benzonatate (TESSALON) 200 MG capsule, Take 1 capsule (200 mg total) by mouth 3 (three) times daily as needed for cough., Disp: 30 capsule, Rfl: 0 .  buPROPion (WELLBUTRIN SR) 150 MG 12 hr tablet, TAKE 1 TABLET(150 MG) BY MOUTH TWICE DAILY, Disp: 180 tablet, Rfl: 0 .  methocarbamol (ROBAXIN) 500 MG tablet, TAKE 1 TABLET BY MOUTH EVERY 8 HOURS AS NEEDED FOR MUSCLE SPASMS, Disp: 90 tablet, Rfl: 1 .  oxyCODONE (OXY IR/ROXICODONE) 5 MG immediate release tablet, Take 1 tablet (5 mg total) by mouth every 8 (eight) hours as needed for severe pain., Disp: 60 tablet, Rfl: 0 .  pantoprazole (PROTONIX) 40 MG tablet, TAKE 1 TABLET BY MOUTH DAILY. GENERIC EQUIVALENT FOR PROTONIX, Disp: 90 tablet, Rfl: 3 .  phentermine (ADIPEX-P) 37.5 MG tablet, TAKE 1 TABLET BY MOUTH DAILY BEFORE BREAKFAST, Disp: 30 tablet, Rfl: 0 .  VITAMIN D PO, Take by mouth., Disp: , Rfl:  .  Vitamin D, Ergocalciferol, (DRISDOL) 1.25 MG (50000 UNIT) CAPS capsule, TAKE 1 CAPSULE (50,000 UNITS TOTAL) BY MOUTH EVERY 7 (SEVEN) DAYS., Disp: 12 capsule, Rfl: 0  Review of Systems  Constitutional: Negative for chills, fatigue  and fever.  HENT: Positive for congestion and rhinorrhea. Negative for ear pain, sinus pain, sneezing and sore throat.   Respiratory: Positive for cough. Negative for wheezing.   Cardiovascular: Negative for chest pain, palpitations and leg swelling.  Gastrointestinal: Negative for abdominal pain, diarrhea, nausea and vomiting.  Musculoskeletal: Negative for neck pain.  Neurological: Positive for  headaches.    Social History   Tobacco Use  . Smoking status: Never Smoker  . Smokeless tobacco: Never Used  Substance Use Topics  . Alcohol use: Yes    Alcohol/week: 8.0 standard drinks    Types: 8 Glasses of wine per week      Objective:   There were no vitals taken for this visit. There were no vitals filed for this visit.There is no height or weight on file to calculate BMI.   Physical Exam Vitals reviewed.  Constitutional:      General: He is not in acute distress. Pulmonary:     Effort: No respiratory distress.  Neurological:     Mental Status: He is alert.      No results found for any visits on 08/20/19.     Assessment & Plan    1. Cough Improving. Has had symptoms for over 2 weeks and has gradually been improving with time and symptomatic relief. Requesting cough suppressant for nighttime cough. Tessalon given as below.  - benzonatate (TESSALON) 200 MG capsule; Take 1 capsule (200 mg total) by mouth 3 (three) times daily as needed for cough.  Dispense: 30 capsule; Refill: 0  2. Vitamin D deficiency H/O this. On OTC Vit D supplement. Will check labs as below and f/u pending results. - CBC w/Diff/Platelet - Vitamin D (25 hydroxy)  3. GAD (generalized anxiety disorder) Stable. Continue Wellbutrin XR 169m and alprazolam 0.55mBID prn. Will check labs as below and f/u pending results. - CBC w/Diff/Platelet - TSH  4. Left wrist pain Stable. Diagnosis pulled for medication refill. Continue current medical treatment plan. - oxyCODONE (OXY IR/ROXICODONE) 5 MG immediate release tablet; Take 1 tablet (5 mg total) by mouth every 8 (eight) hours as needed for severe pain.  Dispense: 60 tablet; Refill: 0  5. Elevated liver enzymes H/O this and has been working on weight loss. Will check labs as below and f/u pending results. - CBC w/Diff/Platelet - Comprehensive Metabolic Panel (CMET) - HgB A1c  6. Prostate cancer screening Will check labs as below and f/u  pending results. - PSA  7. Hypercholesterolemia Diet controlled. Will check labs as below and f/u pending results. - CBC w/Diff/Platelet - Comprehensive Metabolic Panel (CMET) - HgB A1c  8. Class 2 severe obesity due to excess calories with serious comorbidity and body mass index (BMI) of 37.0 to 37.9 in adult [facility]. He is in the 3 month holiday between phentermine at this time and maintaining previously lost weight.    I discussed the assessment and treatment plan with the patient. The patient was provided an opportunity to ask questions and all were answered. The patient agreed with the plan and demonstrated an understanding of the instructions.   The patient was advised to call back or seek an in-person evaluation if the symptoms worsen or if the condition fails to improve as anticipated.  I provided 21 minutes of non-face-to-face time during this encounter.    JeMar DaringPA-C  [facility]

## 2019-08-28 ENCOUNTER — Other Ambulatory Visit: Payer: Self-pay | Admitting: Physician Assistant

## 2019-08-28 DIAGNOSIS — F411 Generalized anxiety disorder: Secondary | ICD-10-CM

## 2019-08-28 NOTE — Telephone Encounter (Signed)
Requested Prescriptions  Pending Prescriptions Disp Refills  . buPROPion (WELLBUTRIN SR) 150 MG 12 hr tablet [Pharmacy Med Name: BUPROPION SR 150MG TABLETS (12 H)] 180 tablet 0    Sig: TAKE 1 TABLET(150 MG) BY MOUTH TWICE DAILY     Psychiatry: Antidepressants - bupropion Passed - 08/28/2019  9:15 AM      Passed - Last BP in normal range    BP Readings from Last 1 Encounters:  01/13/19 105/80         Passed - Valid encounter within last 6 months    Recent Outpatient Visits          1 week ago Cough   [place], Anderson Malta M, [place]   7 months ago Cough   [facility] Fenton Malling M, PA-C   8 months ago Class 2 severe obesity due to excess calories with serious comorbidity and body mass index (BMI) of 37.0 to 37.9 in adult [facility])   [place], Anderson Malta M, [place]   11 months ago Class 3 severe obesity due to excess calories with serious comorbidity and body mass index (BMI) of 40.0 to 44.9 in adult [facility])   [place], Clearnce Sorrel, [place]   1 year ago Annual physical exam   [facility] Fenton Malling M, [place]

## 2019-09-15 ENCOUNTER — Encounter: Payer: Self-pay | Admitting: Physician Assistant

## 2019-09-15 DIAGNOSIS — J4530 Mild persistent asthma, uncomplicated: Secondary | ICD-10-CM

## 2019-09-15 MED ORDER — BREO ELLIPTA 200-25 MCG/INH IN AEPB: 1.0000 | INHALATION_SPRAY | Freq: Every day | RESPIRATORY_TRACT | 1 refills | Status: DC

## 2019-09-16 ENCOUNTER — Other Ambulatory Visit: Payer: Self-pay | Admitting: Physician Assistant

## 2019-09-16 DIAGNOSIS — R059 Cough, unspecified: Secondary | ICD-10-CM

## 2019-09-16 DIAGNOSIS — M25532 Pain in left wrist: Secondary | ICD-10-CM

## 2019-09-16 DIAGNOSIS — R0602 Shortness of breath: Secondary | ICD-10-CM

## 2019-09-16 DIAGNOSIS — R05 Cough: Secondary | ICD-10-CM

## 2019-09-16 MED ORDER — ALBUTEROL SULFATE HFA 108 (90 BASE) MCG/ACT IN AERS: 1.0000 | INHALATION_SPRAY | Freq: Four times a day (QID) | RESPIRATORY_TRACT | 0 refills | Status: DC | PRN

## 2019-09-16 NOTE — Telephone Encounter (Signed)
Please advise 

## 2019-09-17 MED ORDER — OXYCODONE HCL 5 MG PO TABS: 5.0000 mg | ORAL_TABLET | Freq: Three times a day (TID) | ORAL | 0 refills | Status: DC | PRN

## 2019-09-25 DIAGNOSIS — E559 Vitamin D deficiency, unspecified: Secondary | ICD-10-CM | POA: Diagnosis not present

## 2019-09-25 DIAGNOSIS — Z125 Encounter for screening for malignant neoplasm of prostate: Secondary | ICD-10-CM | POA: Diagnosis not present

## 2019-09-25 DIAGNOSIS — E7800 Pure hypercholesterolemia, unspecified: Secondary | ICD-10-CM | POA: Diagnosis not present

## 2019-09-25 DIAGNOSIS — F411 Generalized anxiety disorder: Secondary | ICD-10-CM | POA: Diagnosis not present

## 2019-09-25 DIAGNOSIS — R748 Abnormal levels of other serum enzymes: Secondary | ICD-10-CM | POA: Diagnosis not present

## 2019-09-26 LAB — TSH: TSH: 2.8 u[IU]/mL (ref 0.450–4.500)

## 2019-09-26 LAB — CBC WITH DIFFERENTIAL/PLATELET
Basophils Absolute: 0 10*3/uL (ref 0.0–0.2)
Basos: 0 %
EOS (ABSOLUTE): 0.1 10*3/uL (ref 0.0–0.4)
Eos: 2 %
Hematocrit: 47.4 % (ref 37.5–51.0)
Hemoglobin: 16.2 g/dL (ref 13.0–17.7)
Immature Grans (Abs): 0 10*3/uL (ref 0.0–0.1)
Immature Granulocytes: 0 %
Lymphocytes Absolute: 3 10*3/uL (ref 0.7–3.1)
Lymphs: 43 %
MCH: 31.1 pg (ref 26.6–33.0)
MCHC: 34.2 g/dL (ref 31.5–35.7)
MCV: 91 fL (ref 79–97)
Monocytes Absolute: 0.7 10*3/uL (ref 0.1–0.9)
Monocytes: 9 %
Neutrophils Absolute: 3.1 10*3/uL (ref 1.4–7.0)
Neutrophils: 46 %
Platelets: 243 10*3/uL (ref 150–450)
RBC: 5.21 x10E6/uL (ref 4.14–5.80)
RDW: 12.3 % (ref 11.6–15.4)
WBC: 7 10*3/uL (ref 3.4–10.8)

## 2019-09-26 LAB — COMPREHENSIVE METABOLIC PANEL
ALT: 18 IU/L (ref 0–44)
AST: 17 IU/L (ref 0–40)
Albumin/Globulin Ratio: 1.6 (ref 1.2–2.2)
Albumin: 4.6 g/dL (ref 4.0–5.0)
Alkaline Phosphatase: 60 IU/L (ref 39–117)
BUN/Creatinine Ratio: 15 (ref 9–20)
BUN: 16 mg/dL (ref 6–24)
Bilirubin Total: 0.4 mg/dL (ref 0.0–1.2)
CO2: 22 mmol/L (ref 20–29)
Calcium: 9.7 mg/dL (ref 8.7–10.2)
Chloride: 103 mmol/L (ref 96–106)
Creatinine, Ser: 1.07 mg/dL (ref 0.76–1.27)
GFR calc Af Amer: 94 mL/min/{1.73_m2} (ref >59)
GFR calc non Af Amer: 81 mL/min/{1.73_m2} (ref >59)
Globulin, Total: 2.9 g/dL (ref 1.5–4.5)
Glucose: 95 mg/dL (ref 65–99)
Potassium: 4.8 mmol/L (ref 3.5–5.2)
Sodium: 139 mmol/L (ref 134–144)
Total Protein: 7.5 g/dL (ref 6.0–8.5)

## 2019-09-26 LAB — HEMOGLOBIN A1C
Est. average glucose Bld gHb Est-mCnc: 100 mg/dL
Hgb A1c MFr Bld: 5.1 % (ref 4.8–5.6)

## 2019-09-26 LAB — VITAMIN D 25 HYDROXY (VIT D DEFICIENCY, FRACTURES): Vit D, 25-Hydroxy: 50.3 ng/mL (ref 30.0–100.0)

## 2019-09-26 LAB — PSA: Prostate Specific Ag, Serum: 0.5 ng/mL (ref 0.0–4.0)

## 2019-09-28 ENCOUNTER — Telehealth: Payer: Self-pay

## 2019-09-28 NOTE — Telephone Encounter (Signed)
Blood count is normal. Kidney and liver function are normal. Sodium, potassium and calcium are normal. Thyroid is normal. Vit D is normal. Sugar/A1c is normal. PSA is normal.  Written by Mar Daring, PA-C on 09/28/2019 9:41 AM EDT Seen by patient Francisco Oneal on 09/28/2019 9:59 AM EDT

## 2019-09-28 NOTE — Telephone Encounter (Signed)
-----   Message from Mar Daring, [place] sent at 09/28/2019  9:41 AM EDT ----- Blood count is normal. Kidney and liver function are normal. Sodium, potassium and calcium are normal. Thyroid is normal. Vit D is normal. Sugar/A1c is normal. PSA is normal.

## 2019-09-29 ENCOUNTER — Encounter: Payer: Self-pay | Admitting: Physician Assistant

## 2019-10-05 ENCOUNTER — Other Ambulatory Visit: Payer: Self-pay | Admitting: Physician Assistant

## 2019-10-05 DIAGNOSIS — R059 Cough, unspecified: Secondary | ICD-10-CM

## 2019-10-05 DIAGNOSIS — R05 Cough: Secondary | ICD-10-CM

## 2019-10-05 DIAGNOSIS — R0602 Shortness of breath: Secondary | ICD-10-CM

## 2019-10-05 NOTE — Telephone Encounter (Signed)
Requested Prescriptions  Pending Prescriptions Disp Refills  . VENTOLIN HFA 108 (90 Base) MCG/ACT inhaler [Pharmacy Med Name: VENTOLIN HFA 90 MCG INHALER] 18 g 0    Sig: INHALE 1-2 PUFFS INTO THE LUNGS EVERY 6 (SIX) HOURS AS NEEDED FOR WHEEZING OR SHORTNESS OF BREATH.     Pulmonology:  Beta Agonists Failed - 10/05/2019  1:13 AM      Failed - One inhaler should last at least one month. If the patient is requesting refills earlier, contact the patient to check for uncontrolled symptoms.      Passed - Valid encounter within last 12 months    Recent Outpatient Visits          1 month ago Cough   [facility] Fenton Malling M, [place]   8 months ago Cough   [facility] Fenton Malling M, [place]   9 months ago Class 2 severe obesity due to excess calories with serious comorbidity and body mass index (BMI) of 37.0 to 37.9 in adult [facility])   [place], [place], [place]   1 year ago Class 3 severe obesity due to excess calories with serious comorbidity and body mass index (BMI) of 40.0 to 44.9 in adult [facility])   [place], Clearnce Sorrel, [place]   1 year ago Annual physical exam   [facility] Mar Daring, [place]

## 2019-10-19 ENCOUNTER — Other Ambulatory Visit: Payer: Self-pay | Admitting: Physician Assistant

## 2019-10-19 DIAGNOSIS — E559 Vitamin D deficiency, unspecified: Secondary | ICD-10-CM

## 2019-10-20 ENCOUNTER — Encounter: Payer: Self-pay | Admitting: Physician Assistant

## 2019-10-20 ENCOUNTER — Other Ambulatory Visit: Payer: Self-pay | Admitting: Physician Assistant

## 2019-10-20 DIAGNOSIS — M25532 Pain in left wrist: Secondary | ICD-10-CM

## 2019-10-20 DIAGNOSIS — Z713 Dietary counseling and surveillance: Secondary | ICD-10-CM

## 2019-10-20 MED ORDER — OXYCODONE HCL 5 MG PO TABS: 5.0000 mg | ORAL_TABLET | Freq: Three times a day (TID) | ORAL | 0 refills | Status: DC | PRN

## 2019-10-22 MED ORDER — PHENTERMINE HCL 37.5 MG PO TABS: 37.5000 mg | ORAL_TABLET | Freq: Every day | ORAL | 2 refills | Status: DC

## 2019-10-22 NOTE — Addendum Note (Signed)
Addended by: Mar Daring on: 10/22/2019 07:31 AM   Modules accepted: Orders

## 2019-11-16 ENCOUNTER — Other Ambulatory Visit: Payer: Self-pay | Admitting: Physician Assistant

## 2019-11-16 DIAGNOSIS — M25532 Pain in left wrist: Secondary | ICD-10-CM

## 2019-11-16 MED ORDER — OXYCODONE HCL 5 MG PO TABS: 5.0000 mg | ORAL_TABLET | Freq: Three times a day (TID) | ORAL | 0 refills | Status: DC | PRN

## 2019-11-20 ENCOUNTER — Other Ambulatory Visit: Payer: Self-pay | Admitting: Physician Assistant

## 2019-11-20 ENCOUNTER — Encounter: Payer: Self-pay | Admitting: Physician Assistant

## 2019-11-20 DIAGNOSIS — F411 Generalized anxiety disorder: Secondary | ICD-10-CM

## 2019-11-21 ENCOUNTER — Encounter: Payer: Self-pay | Admitting: Physician Assistant

## 2019-12-01 ENCOUNTER — Encounter: Payer: Self-pay | Admitting: Physician Assistant

## 2019-12-02 ENCOUNTER — Other Ambulatory Visit: Payer: Self-pay | Admitting: Adult Health

## 2019-12-02 ENCOUNTER — Other Ambulatory Visit: Payer: Self-pay

## 2019-12-02 DIAGNOSIS — M62838 Other muscle spasm: Secondary | ICD-10-CM

## 2019-12-02 MED ORDER — METHOCARBAMOL 500 MG PO TABS: 500.0000 mg | ORAL_TABLET | Freq: Three times a day (TID) | ORAL | 1 refills | Status: DC | PRN

## 2019-12-02 MED ORDER — METHOCARBAMOL 500 MG PO TABS: 500.0000 mg | ORAL_TABLET | Freq: Three times a day (TID) | ORAL | 0 refills | Status: DC | PRN

## 2019-12-02 NOTE — Telephone Encounter (Signed)
Please advise? Provider is out of office.

## 2019-12-02 NOTE — Telephone Encounter (Signed)
Meds ordered this encounter  Medications   methocarbamol (ROBAXIN) 500 MG tablet    Sig: Take 1 tablet (500 mg total) by mouth every 8 (eight) hours as needed for muscle spasms.    Dispense:  90 tablet    Refill:  0  Please advise patient to keep follow up primary care appointments with Mar Daring, PA-C, and if any new, persisting or worsening symptoms schedule acute visit. Refilled 90 tablets of Robaxin as above [facility]. Can cause drowsiness.  Let me know if any questions or concerns. PCP is out of office today.  Last refill 10/20 was for 90 tablets.

## 2019-12-02 NOTE — Addendum Note (Signed)
Addended by: Mar Daring on: 12/02/2019 12:09 PM   Modules accepted: Orders

## 2019-12-02 NOTE — Progress Notes (Signed)
Meds ordered this encounter  Medications  . methocarbamol (ROBAXIN) 500 MG tablet    Sig: Take 1 tablet (500 mg total) by mouth every 8 (eight) hours as needed for muscle spasms.    Dispense:  90 tablet    Refill:  0  refilled per patient request 90 tablets. Last refill 90 tablets 04/2019., PCP out of office. Advised by CMA to keep regular primary care appointments with Mar Daring, PA-C and schedule follow up/ acute visit with practice if any worsening or persistent symptoms immediately if needed.

## 2019-12-14 ENCOUNTER — Other Ambulatory Visit: Payer: Self-pay | Admitting: Physician Assistant

## 2019-12-14 DIAGNOSIS — M25532 Pain in left wrist: Secondary | ICD-10-CM

## 2019-12-14 MED ORDER — OXYCODONE HCL 5 MG PO TABS: 5.0000 mg | ORAL_TABLET | Freq: Three times a day (TID) | ORAL | 0 refills | Status: DC | PRN

## 2020-01-06 ENCOUNTER — Other Ambulatory Visit: Payer: Self-pay | Admitting: Physician Assistant

## 2020-01-06 DIAGNOSIS — E559 Vitamin D deficiency, unspecified: Secondary | ICD-10-CM

## 2020-01-06 NOTE — Telephone Encounter (Signed)
Requested medications are due for refill today? Yes  Requested medications are on active medication list?  Yes  Last Refill:   10/19/2019 # 12 with no refill   Future visit scheduled? No   Notes to Clinic:  50,000 IU strengths cannot be delegated.

## 2020-01-13 ENCOUNTER — Other Ambulatory Visit: Payer: Self-pay | Admitting: Physician Assistant

## 2020-01-13 DIAGNOSIS — M25532 Pain in left wrist: Secondary | ICD-10-CM

## 2020-01-14 MED ORDER — OXYCODONE HCL 5 MG PO TABS: 5.0000 mg | ORAL_TABLET | Freq: Three times a day (TID) | ORAL | 0 refills | Status: DC | PRN

## 2020-01-21 ENCOUNTER — Other Ambulatory Visit: Payer: Self-pay | Admitting: Physician Assistant

## 2020-01-21 DIAGNOSIS — F419 Anxiety disorder, unspecified: Secondary | ICD-10-CM

## 2020-01-21 NOTE — Telephone Encounter (Signed)
Requested medication (s) are due for refill today: Yes  Requested medication (s) are on the active medication list: Yes  Last refill:  06/24/19  Future visit scheduled: No  Notes to clinic: See request.    Requested Prescriptions  Pending Prescriptions Disp Refills   ALPRAZolam (XANAX) 0.5 MG tablet [Pharmacy Med Name: ALPRAZOLAM 0.5MG TABLETS] 180 tablet     Sig: TAKE ONE- HALF TO ONE TABLET BY MOUTH TWICE DAILY AS NEEDED FOR ANXIETY. GENERIC EQUIVALENT FOR Duanne Moron      Not Delegated - Psychiatry:  Anxiolytics/Hypnotics Failed - 01/21/2020  7:44 AM      Failed - This refill cannot be delegated      Failed - Urine Drug Screen completed in last 360 days.      Passed - Valid encounter within last 6 months    Recent Outpatient Visits           5 months ago Cough   [place], [place], [place]   1 year ago Cough   [facility] Fenton Malling M, [place]   1 year ago Class 2 severe obesity due to excess calories with serious comorbidity and body mass index (BMI) of 37.0 to 37.9 in adult [facility])   [place], [place], [place]   1 year ago Class 3 severe obesity due to excess calories with serious comorbidity and body mass index (BMI) of 40.0 to 44.9 in adult [facility])   [place], Clearnce Sorrel, [place]   1 year ago Annual physical exam   [facility] Fenton Malling M, [place]

## 2020-02-09 ENCOUNTER — Other Ambulatory Visit: Payer: Self-pay | Admitting: Physician Assistant

## 2020-02-09 DIAGNOSIS — F411 Generalized anxiety disorder: Secondary | ICD-10-CM

## 2020-02-09 NOTE — Telephone Encounter (Signed)
Requested Prescriptions  Pending Prescriptions Disp Refills  . buPROPion (WELLBUTRIN SR) 150 MG 12 hr tablet [Pharmacy Med Name: BUPROPION SR 150MG TABLETS (12 H)] 180 tablet 0    Sig: TAKE 1 TABLET(150 MG) BY MOUTH TWICE DAILY     Psychiatry: Antidepressants - bupropion Passed - 02/09/2020  9:06 AM      Passed - Last BP in normal range    BP Readings from Last 1 Encounters:  01/13/19 105/80         Passed - Valid encounter within last 6 months    Recent Outpatient Visits          5 months ago Cough   [facility] [place], [place], [place]   1 year ago Cough   [place], Anderson Malta M, [place]   1 year ago Class 2 severe obesity due to excess calories with serious comorbidity and body mass index (BMI) of 37.0 to 37.9 in adult [facility])   [place], [place], [place]   1 year ago Class 3 severe obesity due to excess calories with serious comorbidity and body mass index (BMI) of 40.0 to 44.9 in adult [facility])   [place], Clearnce Sorrel, [place]   1 year ago Annual physical exam   [facility] Fenton Malling M, [place]

## 2020-02-17 ENCOUNTER — Other Ambulatory Visit: Payer: Self-pay | Admitting: Physician Assistant

## 2020-02-17 DIAGNOSIS — M25532 Pain in left wrist: Secondary | ICD-10-CM

## 2020-02-18 MED ORDER — OXYCODONE HCL 5 MG PO TABS: 5.0000 mg | ORAL_TABLET | Freq: Three times a day (TID) | ORAL | 0 refills | Status: DC | PRN

## 2020-03-16 ENCOUNTER — Other Ambulatory Visit: Payer: Self-pay | Admitting: Physician Assistant

## 2020-03-16 DIAGNOSIS — F411 Generalized anxiety disorder: Secondary | ICD-10-CM

## 2020-03-16 DIAGNOSIS — M25532 Pain in left wrist: Secondary | ICD-10-CM

## 2020-03-16 MED ORDER — BUPROPION HCL ER (SR) 150 MG PO TB12: 150.0000 mg | ORAL_TABLET | Freq: Two times a day (BID) | ORAL | 1 refills | Status: DC

## 2020-03-16 MED ORDER — OXYCODONE HCL 5 MG PO TABS: 5.0000 mg | ORAL_TABLET | Freq: Three times a day (TID) | ORAL | 0 refills | Status: DC | PRN

## 2020-04-24 ENCOUNTER — Other Ambulatory Visit: Payer: Self-pay | Admitting: Physician Assistant

## 2020-04-24 ENCOUNTER — Encounter: Payer: Self-pay | Admitting: Physician Assistant

## 2020-04-24 DIAGNOSIS — M25532 Pain in left wrist: Secondary | ICD-10-CM

## 2020-04-25 MED ORDER — OXYCODONE HCL 5 MG PO TABS
5.0000 mg | ORAL_TABLET | Freq: Three times a day (TID) | ORAL | 0 refills | Status: DC | PRN
Start: 2020-04-25 — End: 2020-06-22
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
## 2020-04-29 ENCOUNTER — Encounter: Payer: Self-pay | Admitting: Physician Assistant

## 2020-04-29 ENCOUNTER — Ambulatory Visit (INDEPENDENT_AMBULATORY_CARE_PROVIDER_SITE_OTHER): Payer: BC Managed Care – PPO | Admitting: Physician Assistant

## 2020-04-29 ENCOUNTER — Other Ambulatory Visit: Payer: Self-pay

## 2020-04-29 VITALS — BP 153/89 | HR 79 | Temp 98.6°F | Resp 16 | Ht 72.0 in | Wt 288.4 lb

## 2020-04-29 DIAGNOSIS — Z23 Encounter for immunization: Secondary | ICD-10-CM

## 2020-04-29 DIAGNOSIS — Z1211 Encounter for screening for malignant neoplasm of colon: Secondary | ICD-10-CM

## 2020-04-29 DIAGNOSIS — R748 Abnormal levels of other serum enzymes: Secondary | ICD-10-CM

## 2020-04-29 DIAGNOSIS — E7800 Pure hypercholesterolemia, unspecified: Secondary | ICD-10-CM | POA: Diagnosis not present

## 2020-04-29 DIAGNOSIS — Z0000 Encounter for general adult medical examination without abnormal findings: Secondary | ICD-10-CM | POA: Diagnosis not present

## 2020-04-29 DIAGNOSIS — Z6839 Body mass index (BMI) 39.0-39.9, adult: Secondary | ICD-10-CM

## 2020-04-29 DIAGNOSIS — F1190 Opioid use, unspecified, uncomplicated: Secondary | ICD-10-CM | POA: Insufficient documentation

## 2020-04-29 DIAGNOSIS — M7711 Lateral epicondylitis, right elbow: Secondary | ICD-10-CM

## 2020-04-29 NOTE — Patient Instructions (Signed)
Preventive Care 41-50 Years Old, Male Preventive care refers to lifestyle choices and visits with your health care provider that can promote health and wellness. This includes:  A yearly physical exam. This is also called an annual well check.  Regular dental and eye exams.  Immunizations.  Screening for certain conditions.  Healthy lifestyle choices, such as eating a healthy diet, getting regular exercise, not using drugs or products that contain nicotine and tobacco, and limiting alcohol use. What can I expect for my preventive care visit? Physical exam Your health care provider will check:  Height and weight. These may be used to calculate body mass index (BMI), which is a measurement that tells if you are at a healthy weight.  Heart rate and blood pressure.  Your skin for abnormal spots. Counseling Your health care provider may ask you questions about:  Alcohol, tobacco, and drug use.  Emotional well-being.  Home and relationship well-being.  Sexual activity.  Eating habits.  Work and work Statistician. What immunizations do I need?  Influenza (flu) vaccine  This is recommended every year. Tetanus, diphtheria, and pertussis (Tdap) vaccine  You may need a Td booster every 10 years. Varicella (chickenpox) vaccine  You may need this vaccine if you have not already been vaccinated. Zoster (shingles) vaccine  You may need this after age 64. Measles, mumps, and rubella (MMR) vaccine  You may need at least one dose of MMR if you were born in 1957 or later. You may also need a second dose. Pneumococcal conjugate (PCV13) vaccine  You may need this if you have certain conditions and were not previously vaccinated. Pneumococcal polysaccharide (PPSV23) vaccine  You may need one or two doses if you smoke cigarettes or if you have certain conditions. Meningococcal conjugate (MenACWY) vaccine  You may need this if you have certain conditions. Hepatitis A  vaccine  You may need this if you have certain conditions or if you travel or work in places where you may be exposed to hepatitis A. Hepatitis B vaccine  You may need this if you have certain conditions or if you travel or work in places where you may be exposed to hepatitis B. Haemophilus influenzae type b (Hib) vaccine  You may need this if you have certain risk factors. Human papillomavirus (HPV) vaccine  If recommended by your health care provider, you may need three doses over 6 months. You may receive vaccines as individual doses or as more than one vaccine together in one shot (combination vaccines). Talk with your health care provider about the risks and benefits of combination vaccines. What tests do I need? Blood tests  Lipid and cholesterol levels. These may be checked every 5 years, or more frequently if you are over 60 years old.  Hepatitis C test.  Hepatitis B test. Screening  Lung cancer screening. You may have this screening every year starting at age 43 if you have a 30-pack-year history of smoking and currently smoke or have quit within the past 15 years.  Prostate cancer screening. Recommendations will vary depending on your family history and other risks.  Colorectal cancer screening. All adults should have this screening starting at age 72 and continuing until age 2. Your health care provider may recommend screening at age 14 if you are at increased risk. You will have tests every 1-10 years, depending on your results and the type of screening test.  Diabetes screening. This is done by checking your blood sugar (glucose) after you have not eaten  for a while (fasting). You may have this done every 1-3 years.  Sexually transmitted disease (STD) testing. Follow these instructions at home: Eating and drinking  Eat a diet that includes fresh fruits and vegetables, whole grains, lean protein, and low-fat dairy products.  Take vitamin and mineral supplements as  recommended by your health care provider.  Do not drink alcohol if your health care provider tells you not to drink.  If you drink alcohol: ? Limit how much you have to 0-2 drinks a day. ? Be aware of how much alcohol is in your drink. In the [place] one drink equals one 12 oz bottle of beer (355 mL), one 5 oz glass of wine (148 mL), or one 1 oz glass of hard liquor (44 mL). Lifestyle  Take daily care of your teeth and gums.  Stay active. Exercise for at least 30 minutes on 5 or more days each week.  Do not use any products that contain nicotine or tobacco, such as cigarettes, e-cigarettes, and chewing tobacco. If you need help quitting, ask your health care provider.  If you are sexually active, practice safe sex. Use a condom or other form of protection to prevent STIs (sexually transmitted infections).  Talk with your health care provider about taking a low-dose aspirin every day starting at age 53. What's next?  Go to your health care provider once a year for a well check visit.  Ask your health care provider how often you should have your eyes and teeth checked.  Stay up to date on all vaccines. This information is not intended to replace advice given to you by your health care provider. Make sure you discuss any questions you have with your health care provider. Document Revised: 06/19/2018 Document Reviewed: 06/19/2018 Elsevier Patient Education  2020 [employer].

## 2020-04-30 LAB — COMPREHENSIVE METABOLIC PANEL
ALT: 27 IU/L (ref 0–44)
AST: 21 IU/L (ref 0–40)
Albumin/Globulin Ratio: 1.7 (ref 1.2–2.2)
Albumin: 4.7 g/dL (ref 4.0–5.0)
Alkaline Phosphatase: 63 IU/L (ref 44–121)
BUN/Creatinine Ratio: 16 (ref 9–20)
BUN: 16 mg/dL (ref 6–24)
Bilirubin Total: 0.6 mg/dL (ref 0.0–1.2)
CO2: 25 mmol/L (ref 20–29)
Calcium: 9.5 mg/dL (ref 8.7–10.2)
Chloride: 104 mmol/L (ref 96–106)
Creatinine, Ser: 1.01 mg/dL (ref 0.76–1.27)
GFR calc Af Amer: 100 mL/min/{1.73_m2} (ref >59)
GFR calc non Af Amer: 86 mL/min/{1.73_m2} (ref >59)
Globulin, Total: 2.8 g/dL (ref 1.5–4.5)
Glucose: 82 mg/dL (ref 65–99)
Potassium: 4.4 mmol/L (ref 3.5–5.2)
Sodium: 142 mmol/L (ref 134–144)
Total Protein: 7.5 g/dL (ref 6.0–8.5)

## 2020-04-30 LAB — CBC WITH DIFFERENTIAL/PLATELET
Basophils Absolute: 0 10*3/uL (ref 0.0–0.2)
Basos: 0 %
EOS (ABSOLUTE): 0.1 10*3/uL (ref 0.0–0.4)
Eos: 1 %
Hematocrit: 47.6 % (ref 37.5–51.0)
Hemoglobin: 16.1 g/dL (ref 13.0–17.7)
Immature Grans (Abs): 0.1 10*3/uL (ref 0.0–0.1)
Immature Granulocytes: 1 %
Lymphocytes Absolute: 3.7 10*3/uL — ABNORMAL HIGH (ref 0.7–3.1)
Lymphs: 40 %
MCH: 31.6 pg (ref 26.6–33.0)
MCHC: 33.8 g/dL (ref 31.5–35.7)
MCV: 93 fL (ref 79–97)
Monocytes Absolute: 1 10*3/uL — ABNORMAL HIGH (ref 0.1–0.9)
Monocytes: 10 %
Neutrophils Absolute: 4.5 10*3/uL (ref 1.4–7.0)
Neutrophils: 48 %
Platelets: 210 10*3/uL (ref 150–450)
RBC: 5.1 x10E6/uL (ref 4.14–5.80)
RDW: 13.1 % (ref 11.6–15.4)
WBC: 9.4 10*3/uL (ref 3.4–10.8)

## 2020-04-30 LAB — LIPID PANEL
Chol/HDL Ratio: 5 ratio (ref 0.0–5.0)
Cholesterol, Total: 249 mg/dL — ABNORMAL HIGH (ref 100–199)
HDL: 50 mg/dL (ref >39)
LDL Chol Calc (NIH): 181 mg/dL — ABNORMAL HIGH (ref 0–99)
Triglycerides: 102 mg/dL (ref 0–149)
VLDL Cholesterol Cal: 18 mg/dL (ref 5–40)

## 2020-05-02 ENCOUNTER — Other Ambulatory Visit: Payer: Self-pay | Admitting: Physician Assistant

## 2020-05-02 DIAGNOSIS — F411 Generalized anxiety disorder: Secondary | ICD-10-CM

## 2020-05-02 LAB — PAIN MGT SCRN (14 DRUGS), UR
Amphetamine Scrn, Ur: NEGATIVE ng/mL
BARBITURATE SCREEN URINE: NEGATIVE ng/mL
BENZODIAZEPINE SCREEN, URINE: NEGATIVE ng/mL
Buprenorphine, Urine: NEGATIVE ng/mL
CANNABINOIDS UR QL SCN: NEGATIVE ng/mL
Cocaine (Metab) Scrn, Ur: NEGATIVE ng/mL
Creatinine(Crt), U: 366.5 mg/dL — ABNORMAL HIGH (ref 20.0–300.0)
Fentanyl, Urine: NEGATIVE pg/mL
Meperidine Screen, Urine: NEGATIVE ng/mL
Methadone Screen, Urine: NEGATIVE ng/mL
OXYCODONE+OXYMORPHONE UR QL SCN: NEGATIVE ng/mL
Opiate Scrn, Ur: NEGATIVE ng/mL
Ph of Urine: 5.8 (ref 4.5–8.9)
Phencyclidine Qn, Ur: NEGATIVE ng/mL
Propoxyphene Scrn, Ur: NEGATIVE ng/mL
Tramadol Screen, Urine: NEGATIVE ng/mL

## 2020-05-02 MED ORDER — BUPROPION HCL ER (SR) 150 MG PO TB12: 150.0000 mg | ORAL_TABLET | Freq: Two times a day (BID) | ORAL | 1 refills | Status: DC

## 2020-05-02 NOTE — Telephone Encounter (Signed)
[facility] faxed refill request for the following medications:  buPROPion (WELLBUTRIN SR) 150 MG 12 hr tablet   Please advise.

## 2020-05-02 NOTE — Telephone Encounter (Signed)
Please review. Thanks!  

## 2020-05-20 ENCOUNTER — Telehealth: Payer: Self-pay

## 2020-05-20 LAB — EXTERNAL GENERIC LAB PROCEDURE: COLOGUARD: NEGATIVE

## 2020-05-20 LAB — COLOGUARD
COLOGUARD: NEGATIVE
Cologuard: NEGATIVE

## 2020-05-20 NOTE — Telephone Encounter (Signed)
-----   Message from Mar Daring, PA-C sent at 05/20/2020 11:23 AM EST ----- Negative cologuard. May repeat in 3 years.

## 2020-05-20 NOTE — Telephone Encounter (Signed)
Written by Mar Daring, PA-C on 05/20/2020 11:23 AM EST Seen by patient Francisco Oneal on 05/20/2020 11:23 AM

## 2020-05-27 ENCOUNTER — Other Ambulatory Visit: Payer: Self-pay | Admitting: Physician Assistant

## 2020-05-27 DIAGNOSIS — K219 Gastro-esophageal reflux disease without esophagitis: Secondary | ICD-10-CM

## 2020-06-08 ENCOUNTER — Encounter: Payer: BC Managed Care – PPO | Admitting: Physician Assistant

## 2020-06-22 ENCOUNTER — Encounter: Payer: Self-pay | Admitting: Physician Assistant

## 2020-06-22 ENCOUNTER — Other Ambulatory Visit: Payer: Self-pay | Admitting: Physician Assistant

## 2020-06-22 DIAGNOSIS — F419 Anxiety disorder, unspecified: Secondary | ICD-10-CM

## 2020-06-22 DIAGNOSIS — M25532 Pain in left wrist: Secondary | ICD-10-CM

## 2020-06-22 DIAGNOSIS — K219 Gastro-esophageal reflux disease without esophagitis: Secondary | ICD-10-CM

## 2020-06-22 DIAGNOSIS — M62838 Other muscle spasm: Secondary | ICD-10-CM

## 2020-06-23 MED ORDER — PANTOPRAZOLE SODIUM 40 MG PO TBEC: DELAYED_RELEASE_TABLET | ORAL | 1 refills | Status: DC

## 2020-06-23 MED ORDER — METHOCARBAMOL 500 MG PO TABS
500.0000 mg | ORAL_TABLET | Freq: Three times a day (TID) | ORAL | 1 refills | Status: DC | PRN
Start: 2020-06-23 — End: 2020-09-05

## 2020-06-23 MED ORDER — ALPRAZOLAM 0.5 MG PO TABS
ORAL_TABLET | ORAL | 1 refills | Status: DC
Start: 2020-06-23 — End: 2021-02-21

## 2020-06-23 MED ORDER — OXYCODONE HCL 5 MG PO TABS: 5.0000 mg | ORAL_TABLET | Freq: Three times a day (TID) | ORAL | 0 refills | Status: DC | PRN

## 2020-06-26 ENCOUNTER — Encounter: Payer: Self-pay | Admitting: Physician Assistant

## 2020-07-11 ENCOUNTER — Encounter: Payer: Self-pay | Admitting: Physician Assistant

## 2020-07-12 MED ORDER — BD PEN NEEDLE NANO 2ND GEN 32G X 4 MM MISC: 4 refills | Status: DC

## 2020-07-12 MED ORDER — SAXENDA 18 MG/3ML ~~LOC~~ SOPN: 0.6000 mg | PEN_INJECTOR | Freq: Every day | SUBCUTANEOUS | 3 refills | Status: DC

## 2020-07-18 ENCOUNTER — Other Ambulatory Visit: Payer: Self-pay | Admitting: Physician Assistant

## 2020-07-18 ENCOUNTER — Telehealth: Payer: Self-pay

## 2020-07-18 DIAGNOSIS — E559 Vitamin D deficiency, unspecified: Secondary | ICD-10-CM

## 2020-07-18 MED ORDER — SAXENDA 18 MG/3ML ~~LOC~~ SOPN: 0.6000 mg | PEN_INJECTOR | Freq: Every day | SUBCUTANEOUS | 3 refills | Status: DC

## 2020-07-18 MED ORDER — BD PEN NEEDLE NANO 2ND GEN 32G X 4 MM MISC
4 refills | Status: DC
Start: 2020-07-18 — End: 2022-05-17

## 2020-07-18 NOTE — Telephone Encounter (Signed)
Requested medication (s) are due for refill today: yes  Requested medication (s) are on the active medication list: yes  Last refill:  04/17/2020  Future visit scheduled: no  Notes to clinic: this refill cannot be delegated    Requested Prescriptions  Pending Prescriptions Disp Refills   Vitamin D, Ergocalciferol, (DRISDOL) 1.25 MG (50000 UNIT) CAPS capsule [Pharmacy Med Name: VITAMIN D2 1.25MG(50,000 UNIT)] 12 capsule 1    Sig: TAKE 1 CAPSULE (50,000 UNITS TOTAL) BY MOUTH EVERY 7 (SEVEN) DAYS.      Endocrinology:  Vitamins - Vitamin D Supplementation Failed - 07/18/2020  1:34 AM      Failed - 50,000 IU strengths are not delegated      Failed - Phosphate in normal range and within 360 days    No results found for: PHOS        Passed - Ca in normal range and within 360 days    Calcium  Date Value Ref Range Status  04/29/2020 9.5 8.7 - 10.2 mg/dL Final   Calcium, Total  Date Value Ref Range Status  06/07/2013 9.4 8.5 - 10.1 mg/dL Final          Passed - Vitamin D in normal range and within 360 days    Vit D, 25-Hydroxy  Date Value Ref Range Status  09/25/2019 50.3 30.0 - 100.0 ng/mL Final    Comment:    Vitamin D deficiency has been defined by the Institute of Medicine and an Endocrine Society practice guideline as a level of serum 25-OH vitamin D less than 20 ng/mL (1,2). The Endocrine Society went on to further define vitamin D insufficiency as a level between 21 and 29 ng/mL (2). 1. IOM (Institute of Medicine). 2010. Dietary reference    intakes for calcium and D. [place]: The    [employer]. 2. Holick MF, Binkley [place], Bischoff-Ferrari HA, et al.    Evaluation, treatment, and prevention of vitamin D    deficiency: an Endocrine Society clinical practice    guideline. JCEM. 2011 Jul; 96(7):1911-30.           Passed - Valid encounter within last 12 months    Recent Outpatient Visits           2 months ago Annual physical exam   [facility] [place], [place]   11 months ago Cough   [place], [place], [place]   1 year ago Cough   [facility] [place], [place]   1 year ago Class 2 severe obesity due to excess calories with serious comorbidity and body mass index (BMI) of 37.0 to 37.9 in adult [facility])   [place], [place], [place]   1 year ago Class 3 severe obesity due to excess calories with serious comorbidity and body mass index (BMI) of 40.0 to 44.9 in adult [facility])   [facility] [place], [place], [place]

## 2020-07-18 NOTE — Telephone Encounter (Signed)
Copied from [place] [id number]. Topic: Quick Communication - See Telephone Encounter >> Jul 18, 2020  4:02 PM Loma [place] wrote: CRM for notification. See Telephone encounter for: 07/18/20. Pt stating that has gotten approval on his Saxenda and states the letter says you may now request provider to send. Please send to [place], [place] to Registered [place]  Phone:  [phone]    Any questions [place] [phone]

## 2020-07-18 NOTE — Progress Notes (Signed)
Saxenda sent to [facility] per request

## 2020-07-20 NOTE — Telephone Encounter (Signed)
I think this is old message as this has been done.

## 2020-07-22 ENCOUNTER — Other Ambulatory Visit: Payer: Self-pay | Admitting: Physician Assistant

## 2020-07-22 DIAGNOSIS — M25532 Pain in left wrist: Secondary | ICD-10-CM

## 2020-07-22 DIAGNOSIS — R059 Cough, unspecified: Secondary | ICD-10-CM

## 2020-07-22 DIAGNOSIS — R0602 Shortness of breath: Secondary | ICD-10-CM

## 2020-07-22 MED ORDER — OXYCODONE HCL 5 MG PO TABS: 5.0000 mg | ORAL_TABLET | Freq: Three times a day (TID) | ORAL | 0 refills | Status: DC | PRN

## 2020-07-22 MED ORDER — ALBUTEROL SULFATE HFA 108 (90 BASE) MCG/ACT IN AERS
1.0000 | INHALATION_SPRAY | Freq: Four times a day (QID) | RESPIRATORY_TRACT | 1 refills | Status: DC | PRN
Start: 2020-07-22 — End: 2020-10-27

## 2020-08-22 ENCOUNTER — Other Ambulatory Visit: Payer: Self-pay | Admitting: Physician Assistant

## 2020-08-22 DIAGNOSIS — M25532 Pain in left wrist: Secondary | ICD-10-CM

## 2020-08-22 MED ORDER — OXYCODONE HCL 5 MG PO TABS: 5.0000 mg | ORAL_TABLET | Freq: Three times a day (TID) | ORAL | 0 refills | Status: DC | PRN

## 2020-09-04 ENCOUNTER — Other Ambulatory Visit: Payer: Self-pay | Admitting: Physician Assistant

## 2020-09-04 DIAGNOSIS — M62838 Other muscle spasm: Secondary | ICD-10-CM

## 2020-09-04 NOTE — Telephone Encounter (Signed)
Requested medication (s) are due for refill today: yes  Requested medication (s) are on the active medication list: yes  Last refill:  06/23/20  Future visit scheduled: no  Notes to clinic:  med not delegated to NT to RF   Requested Prescriptions  Pending Prescriptions Disp Refills   methocarbamol (ROBAXIN) 500 MG tablet [Pharmacy Med Name: METHOCARBAMOL 500 MG TABLET] 90 tablet 1    Sig: TAKE 1 TABLET BY MOUTH EVERY 8 HOURS AS NEEDED FOR MUSCLE SPASMS.      Not Delegated - Analgesics:  Muscle Relaxants Failed - 09/04/2020 10:40 AM      Failed - This refill cannot be delegated      Passed - Valid encounter within last 6 months    Recent Outpatient Visits           4 months ago Annual physical exam   [facility] [place], [place]   1 year ago Cough   [facility] [place], [place]   1 year ago Cough   [facility] [place], [place]   1 year ago Class 2 severe obesity due to excess calories with serious comorbidity and body mass index (BMI) of 37.0 to 37.9 in adult [facility])   [place], [place], [place]   1 year ago Class 3 severe obesity due to excess calories with serious comorbidity and body mass index (BMI) of 40.0 to 44.9 in adult [facility])   [facility] [place], [place], [place]

## 2020-09-05 ENCOUNTER — Encounter: Payer: Self-pay | Admitting: Physician Assistant

## 2020-09-19 ENCOUNTER — Other Ambulatory Visit: Payer: Self-pay | Admitting: Physician Assistant

## 2020-09-19 DIAGNOSIS — M25532 Pain in left wrist: Secondary | ICD-10-CM

## 2020-09-19 MED ORDER — OXYCODONE HCL 5 MG PO TABS: 5.0000 mg | ORAL_TABLET | Freq: Three times a day (TID) | ORAL | 0 refills | Status: DC | PRN

## 2020-09-21 ENCOUNTER — Other Ambulatory Visit: Payer: Self-pay

## 2020-09-21 ENCOUNTER — Ambulatory Visit (INDEPENDENT_AMBULATORY_CARE_PROVIDER_SITE_OTHER): Payer: 59 | Admitting: Physician Assistant

## 2020-09-21 VITALS — BP 124/83 | HR 76 | Temp 98.4°F | Wt 291.0 lb

## 2020-09-21 DIAGNOSIS — M25532 Pain in left wrist: Secondary | ICD-10-CM

## 2020-09-21 DIAGNOSIS — Z6839 Body mass index (BMI) 39.0-39.9, adult: Secondary | ICD-10-CM | POA: Diagnosis not present

## 2020-09-21 DIAGNOSIS — F1190 Opioid use, unspecified, uncomplicated: Secondary | ICD-10-CM | POA: Diagnosis not present

## 2020-09-21 NOTE — Progress Notes (Signed)
Established patient visit   Patient: Francisco Oneal   DOB: 24-Sep-1970   50 y.o. Male  MRN: [id number] Visit Date: 09/21/2020  Today's healthcare provider: Mar Daring, PA-C   No chief complaint on file.  Subjective    HPI  Patient is a 50 year old male who presents today to discuss transition to another provider.  He reports that until coming here he never really went to the doctor much.  Would like input on where to turn for further care.   [place] Office Visit from 09/21/2020 in [place]  AUDIT-C Score 6        Medications: Outpatient Medications Prior to Visit  Medication Sig  . albuterol (VENTOLIN HFA) 108 (90 Base) MCG/ACT inhaler Inhale 1 puff into the lungs every 6 (six) hours as needed for wheezing or shortness of breath.  . ALPRAZolam (XANAX) 0.5 MG tablet TAKE ONE- HALF TO ONE TABLET BY MOUTH TWICE DAILY AS NEEDED FOR ANXIETY. GENERIC EQUIVALENT FOR XANAX  . fluticasone furoate-vilanterol (BREO ELLIPTA) 200-25 MCG/INH AEPB Inhale 1 puff into the lungs daily.  . Insulin Pen Needle (BD PEN NEEDLE NANO 2ND GEN) 32G X 4 MM MISC To use with saxenda injections  . Liraglutide -Weight Management (SAXENDA) 18 MG/3ML SOPN Inject 0.6 mg into the skin daily. Increase by 0.76m weekly until max dose of 32machieved  . methocarbamol (ROBAXIN) 500 MG tablet TAKE 1 TABLET BY MOUTH EVERY 8 HOURS AS NEEDED FOR MUSCLE SPASMS.  . Marland KitchenxyCODONE (OXY IR/ROXICODONE) 5 MG immediate release tablet Take 1 tablet (5 mg total) by mouth every 8 (eight) hours as needed for severe pain.  . pantoprazole (PROTONIX) 40 MG tablet TAKE 1 TABLET BY MOUTH DAILY. GENERIC EQUIVALENT FOR PROTONIX  . VITAMIN D PO Take by mouth.  . Vitamin D, Ergocalciferol, (DRISDOL) 1.25 MG (50000 UNIT) CAPS capsule TAKE 1 CAPSULE (50,000 UNITS TOTAL) BY MOUTH EVERY 7 (SEVEN) DAYS.  . Marland Kitchenhentermine (ADIPEX-P) 37.5 MG tablet Take 1 tablet (37.5 mg total) by mouth daily before breakfast.   No  facility-administered medications prior to visit.    Review of Systems  Constitutional: Negative.   Respiratory: Negative for cough, shortness of breath and wheezing.   Cardiovascular: Negative.   Endocrine: Negative for polydipsia and polyuria.        Objective    BP 124/83 (BP Location: Left Arm, Patient Position: Sitting, Cuff Size: Normal)   Pulse 76   Temp 98.4 F (36.9 C) (Oral)   Wt 291 lb (132 kg)   SpO2 96%   BMI 39.47 kg/m      Physical Exam Vitals reviewed.  Constitutional:      General: He is not in acute distress.    Appearance: He is well-developed.  HENT:     Head: Normocephalic and atraumatic.  Eyes:     Conjunctiva/sclera: Conjunctivae normal.  Pulmonary:     Effort: Pulmonary effort is normal. No respiratory distress.  Musculoskeletal:     Cervical back: Normal range of motion and neck supple.  Psychiatric:        Behavior: Behavior normal.        Thought Content: Thought content normal.        Judgment: Judgment normal.      No results found for any visits on 09/21/20.  Assessment & Plan     1. Class 2 severe obesity due to excess calories with serious comorbidity and body mass index (BMI) of 39.0 to 39.9 in  adult [facility]) Trying to get Saxenda approved for renewal.   2. Chronic narcotic use Uses Oxycodone IR 70m prn for left wrist pain. Does not need daily.   3. Left wrist pain See above medical treatment plan.   No follow-ups on file.      IReynolds Bowl PA-C, have reviewed all documentation for this visit. The documentation on 09/25/20 for the exam, diagnosis, procedures, and orders are all accurate and complete.   JRubye Beach BE [phone](phone) [phone](fax)  [place]

## 2020-09-25 ENCOUNTER — Encounter: Payer: Self-pay | Admitting: Physician Assistant

## 2020-09-27 ENCOUNTER — Encounter: Payer: Self-pay | Admitting: Physician Assistant

## 2020-10-06 ENCOUNTER — Encounter: Payer: Self-pay | Admitting: Physician Assistant

## 2020-10-07 NOTE — Telephone Encounter (Signed)
Can we try for an appeal for Saxenda please with his home weights.  JB

## 2020-10-11 NOTE — Telephone Encounter (Signed)
I resubmitted the PA through Cover My Meds.    Thanks,   -Mickel Baas

## 2020-10-24 ENCOUNTER — Other Ambulatory Visit: Payer: Self-pay | Admitting: Physician Assistant

## 2020-10-24 DIAGNOSIS — M25532 Pain in left wrist: Secondary | ICD-10-CM

## 2020-10-24 MED ORDER — OXYCODONE HCL 5 MG PO TABS: 5.0000 mg | ORAL_TABLET | Freq: Three times a day (TID) | ORAL | 0 refills | Status: DC | PRN

## 2020-10-27 ENCOUNTER — Other Ambulatory Visit: Payer: Self-pay | Admitting: Physician Assistant

## 2020-10-27 DIAGNOSIS — M62838 Other muscle spasm: Secondary | ICD-10-CM

## 2020-10-27 DIAGNOSIS — R059 Cough, unspecified: Secondary | ICD-10-CM

## 2020-10-27 DIAGNOSIS — R0602 Shortness of breath: Secondary | ICD-10-CM

## 2020-11-01 ENCOUNTER — Encounter: Payer: Self-pay | Admitting: Family Medicine

## 2020-11-01 NOTE — Progress Notes (Unsigned)
{  Click to show pharmacy dispense history report in sidebar:1}

## 2020-11-23 ENCOUNTER — Other Ambulatory Visit: Payer: Self-pay | Admitting: Family Medicine

## 2020-11-23 DIAGNOSIS — M25532 Pain in left wrist: Secondary | ICD-10-CM

## 2020-11-23 MED ORDER — OXYCODONE HCL 5 MG PO TABS: 5.0000 mg | ORAL_TABLET | Freq: Three times a day (TID) | ORAL | 0 refills | Status: DC | PRN

## 2020-12-03 ENCOUNTER — Other Ambulatory Visit: Payer: Self-pay | Admitting: Physician Assistant

## 2020-12-03 DIAGNOSIS — K219 Gastro-esophageal reflux disease without esophagitis: Secondary | ICD-10-CM

## 2020-12-12 ENCOUNTER — Other Ambulatory Visit: Payer: Self-pay

## 2020-12-12 DIAGNOSIS — K219 Gastro-esophageal reflux disease without esophagitis: Secondary | ICD-10-CM

## 2020-12-12 MED ORDER — PANTOPRAZOLE SODIUM 40 MG PO TBEC: DELAYED_RELEASE_TABLET | ORAL | 1 refills | Status: DC

## 2020-12-23 ENCOUNTER — Other Ambulatory Visit: Payer: Self-pay | Admitting: Family Medicine

## 2020-12-23 DIAGNOSIS — M62838 Other muscle spasm: Secondary | ICD-10-CM

## 2020-12-23 DIAGNOSIS — M25532 Pain in left wrist: Secondary | ICD-10-CM

## 2020-12-26 MED ORDER — OXYCODONE HCL 5 MG PO TABS: 5.0000 mg | ORAL_TABLET | Freq: Three times a day (TID) | ORAL | 0 refills | Status: DC | PRN

## 2021-01-16 ENCOUNTER — Other Ambulatory Visit: Payer: Self-pay | Admitting: Physician Assistant

## 2021-01-16 DIAGNOSIS — E559 Vitamin D deficiency, unspecified: Secondary | ICD-10-CM

## 2021-01-18 NOTE — Telephone Encounter (Signed)
Please review:   Last Vitamin D 09/25/2019   Component Ref Range & Units 1 yr ago 2 yr ago  Vit D, 25-Hydroxy 30.0 - 100.0 ng/mL 50.3  17.4 Low  CM

## 2021-01-21 ENCOUNTER — Other Ambulatory Visit: Payer: Self-pay | Admitting: Physician Assistant

## 2021-01-21 DIAGNOSIS — Z6839 Body mass index (BMI) 39.0-39.9, adult: Secondary | ICD-10-CM

## 2021-01-30 ENCOUNTER — Ambulatory Visit: Payer: Self-pay | Admitting: *Deleted

## 2021-01-30 ENCOUNTER — Ambulatory Visit (INDEPENDENT_AMBULATORY_CARE_PROVIDER_SITE_OTHER): Payer: 59 | Admitting: Internal Medicine

## 2021-01-30 ENCOUNTER — Other Ambulatory Visit: Payer: Self-pay

## 2021-01-30 ENCOUNTER — Encounter: Payer: Self-pay | Admitting: Internal Medicine

## 2021-01-30 VITALS — BP 130/87 | HR 73 | Temp 98.7°F | Ht 72.05 in | Wt 307.0 lb

## 2021-01-30 DIAGNOSIS — T783XXA Angioneurotic edema, initial encounter: Secondary | ICD-10-CM

## 2021-01-30 DIAGNOSIS — T63441A Toxic effect of venom of bees, accidental (unintentional), initial encounter: Secondary | ICD-10-CM | POA: Diagnosis not present

## 2021-01-30 MED ORDER — EPINEPHRINE 0.3 MG/0.3ML IJ SOAJ: 0.3000 mg | INTRAMUSCULAR | 1 refills | Status: DC | PRN

## 2021-01-30 MED ORDER — FAMOTIDINE 10 MG PO TABS
40.0000 mg | ORAL_TABLET | Freq: Once | ORAL | Status: AC
  Administered 2021-01-30: 40 mg via ORAL

## 2021-01-30 MED ORDER — METHYLPREDNISOLONE 4 MG PO TBPK: ORAL_TABLET | ORAL | 0 refills | Status: DC

## 2021-01-30 MED ORDER — FAMOTIDINE 10 MG PO TABS: 10.0000 mg | ORAL_TABLET | Freq: Once | ORAL | Status: DC

## 2021-01-30 MED ORDER — FAMOTIDINE 20 MG PO TABS: 20.0000 mg | ORAL_TABLET | Freq: Every day | ORAL | 0 refills | Status: DC

## 2021-01-30 MED ORDER — FEXOFENADINE HCL 180 MG PO TABS: 180.0000 mg | ORAL_TABLET | Freq: Every day | ORAL | 0 refills | Status: DC

## 2021-01-30 MED ORDER — METHYLPREDNISOLONE SODIUM SUCC 40 MG IJ SOLR
120.0000 mg | Freq: Once | INTRAMUSCULAR | Status: AC
  Administered 2021-01-30: 120 mg via INTRAMUSCULAR

## 2021-01-30 NOTE — Progress Notes (Signed)
BP 130/87   Pulse 73   Temp 98.7 F (37.1 C) (Oral)   Ht 6' 0.05" (1.83 m)   Wt (!) 307 lb (139.3 kg)   SpO2 98%   BMI 41.58 kg/m    Subjective:    Patient ID: Francisco Oneal, male    DOB: [birth year], 50 y.o.   MRN: [id number]  Chief Complaint  Patient presents with   Insect Bite    Was stung by Yellow Jacket yesterday around 2 pm. Was stung on forehead above left eye, Face  B/L is now swollen from forehead to the bottom of cheek. Swelling has not reached his throat.    HPI: Francisco Oneal is a 50 y.o. male  Patient presents with: Insect Bite: Was stung by Yellow Jacket yesterday around 2 pm. Was stung on forehead above left eye, Face  B/L is now swollen from forehead to the bottom of cheek. Swelling has not reached his throat. Worse on the bil eyes has swelling under bil eyes and upper face, none on the lips/ tongue      Relevant past medical, surgical, family and social history reviewed and updated as indicated. Interim medical history since our last visit reviewed. Allergies and medications reviewed and updated.  Review of Systems  Per HPI unless specifically indicated above     Objective:    BP 130/87   Pulse 73   Temp 98.7 F (37.1 C) (Oral)   Ht 6' 0.05" (1.83 m)   Wt (!) 307 lb (139.3 kg)   SpO2 98%   BMI 41.58 kg/m   Wt Readings from Last 3 Encounters:  01/30/21 (!) 307 lb (139.3 kg)  09/21/20 291 lb (132 kg)  04/29/20 288 lb 6.4 oz (130.8 kg)    Physical Exam Vitals and nursing note reviewed.  Constitutional:      General: He is not in acute distress.    Appearance: Normal appearance. He is not ill-appearing or diaphoretic.  HENT:     Head: Normocephalic and atraumatic.     Right Ear: Tympanic membrane and external ear normal. There is no impacted cerumen.     Left Ear: External ear normal.     Nose: No congestion or rhinorrhea.     Mouth/Throat:     Pharynx: No oropharyngeal exudate or posterior oropharyngeal erythema.  Eyes:      Conjunctiva/sclera: Conjunctivae normal.     Pupils: Pupils are equal, round, and reactive to light.  Cardiovascular:     Rate and Rhythm: Normal rate and regular rhythm.     Heart sounds: No murmur heard.   No friction rub. No gallop.  Pulmonary:     Effort: No respiratory distress.     Breath sounds: No stridor. No wheezing or rhonchi.  Chest:     Chest wall: No tenderness.  Abdominal:     General: Abdomen is flat. Bowel sounds are normal.     Palpations: Abdomen is soft. There is no mass.     Tenderness: There is no abdominal tenderness.  Musculoskeletal:     Cervical back: Normal range of motion and neck supple. No rigidity or tenderness.     Left lower leg: No edema.  Skin:    General: Skin is warm and dry.  Neurological:     Mental Status: He is alert.          Current Outpatient Medications:    albuterol (VENTOLIN HFA) 108 (90 Base) MCG/ACT inhaler, INHALE 1 PUFF INTO THE LUNGS EVERY 6 HOURS  AS NEEDED FOR WHEEZING OR SHORTNESS OF BREATH., Disp: 18 each, Rfl: 1   ALPRAZolam (XANAX) 0.5 MG tablet, TAKE ONE- HALF TO ONE TABLET BY MOUTH TWICE DAILY AS NEEDED FOR ANXIETY. GENERIC EQUIVALENT FOR XANAX, Disp: 180 tablet, Rfl: 1   EPINEPHrine (EPIPEN 2-PAK) 0.3 mg/0.3 mL IJ SOAJ injection, Inject 0.3 mg into the muscle as needed for anaphylaxis., Disp: 1 each, Rfl: 1   famotidine (PEPCID) 20 MG tablet, Take 1 tablet (20 mg total) by mouth at bedtime., Disp: 10 tablet, Rfl: 0   fexofenadine (ALLEGRA ALLERGY) 180 MG tablet, Take 1 tablet (180 mg total) by mouth daily., Disp: 10 tablet, Rfl: 0   fluticasone furoate-vilanterol (BREO ELLIPTA) 200-25 MCG/INH AEPB, Inhale 1 puff into the lungs daily., Disp: 60 each, Rfl: 1   Insulin Pen Needle (BD PEN NEEDLE NANO 2ND GEN) 32G X 4 MM MISC, To use with saxenda injections, Disp: 100 each, Rfl: 4   Liraglutide -Weight Management (SAXENDA) 18 MG/3ML SOPN, Inject 0.6 mg into the skin daily. Increase by 0.41m weekly until max dose of 39m achieved, Disp: 12 mL, Rfl: 3   methocarbamol (ROBAXIN) 500 MG tablet, TAKE 1 TABLET BY MOUTH EVERY 8 HOURS AS NEEDED FOR MUSCLE SPASMS, Disp: 90 tablet, Rfl: 1   methylPREDNISolone (MEDROL DOSEPAK) 4 MG TBPK tablet, Use as directed, Disp: 1 each, Rfl: 0   oxyCODONE (OXY IR/ROXICODONE) 5 MG immediate release tablet, Take 1 tablet (5 mg total) by mouth every 8 (eight) hours as needed for severe pain., Disp: 60 tablet, Rfl: 0   pantoprazole (PROTONIX) 40 MG tablet, TAKE 1 TABLET BY MOUTH DAILY. GENERIC EQUIVALENT FOR PROTONIX, Disp: 90 tablet, Rfl: 1   VITAMIN D PO, Take by mouth., Disp: , Rfl:    Vitamin D, Ergocalciferol, (DRISDOL) 1.25 MG (50000 UNIT) CAPS capsule, TAKE 1 CAPSULE (50,000 UNITS TOTAL) BY MOUTH EVERY 7 (SEVEN) DAYS, Disp: 12 capsule, Rfl: 1    Assessment & Plan:  Allergic reaction bee sting will need to start pt on medrol dose pak Use allegra q pm  x 10 days. Use famotidine20 mg q pm as well x 10 days Epi pen refills sent in    Problem List Items Addressed This Visit   None   No orders of the defined types were placed in this encounter.    Meds ordered this encounter  Medications   methylPREDNISolone (MEDROL DOSEPAK) 4 MG TBPK tablet    Sig: Use as directed    Dispense:  1 each    Refill:  0   EPINEPHrine (EPIPEN 2-PAK) 0.3 mg/0.3 mL IJ SOAJ injection    Sig: Inject 0.3 mg into the muscle as needed for anaphylaxis.    Dispense:  1 each    Refill:  1   fexofenadine (ALLEGRA ALLERGY) 180 MG tablet    Sig: Take 1 tablet (180 mg total) by mouth daily.    Dispense:  10 tablet    Refill:  0   famotidine (PEPCID) 20 MG tablet    Sig: Take 1 tablet (20 mg total) by mouth at bedtime.    Dispense:  10 tablet    Refill:  0     Follow up plan: No follow-ups on file.

## 2021-01-30 NOTE — Telephone Encounter (Signed)
Stung by bee yesterday on forehead above the right eye. Hours later both eyes swollen shut. If he lifts the eyelid his vision is normal. Having fever-not able to check.  He has been allergic to bee stings in the past.   Denies difficulty breathing, tongue/throat not swelling and no changes in speech.  No availability with pcp office. Routing to clinic for a check with [facility] for any openings this afternoon.  Reason for Disposition  Swelling is huge (e.g., more than 4 inches or 10 cm, spreads beyond wrist or ankle)  Answer Assessment - Initial Assessment Questions 1. TYPE: "What type of sting was it?" (bee, yellow jacket, etc.)      bee 2. ONSET: "When did it occur?"      Yesterday mid day 3. LOCATION: "Where is the sting located?"  "How many stings?"     Above right eye on forehead 4. SWELLING SIZE: "How big is the swelling?" (e.g., inches or cm)     Eyelids swollen shut 5. REDNESS: "Is the area red or pink?" If Yes, ask: "What size is area of redness?" (e.g., inches or cm). "When did the redness start?"     pink 6. PAIN: "Is there any pain?" If Yes, ask: "How bad is it?"  (Scale 1-10; or mild, moderate, severe)     no 7. ITCHING: "Is there any itching?" If Yes, ask: "How bad is it?"      no 8. RESPIRATORY DISTRESS: "Describe your breathing."     no 9. PRIOR REACTIONS: "Have you had any severe allergic reactions to stings in the past?" if yes, ask: "What happened?"     Yes-? 10. OTHER SYMPTOMS: "Do you have any other symptoms?" (e.g., abdominal pain, face or tongue swelling, new rash elsewhere, vomiting)       none 11. PREGNANCY: "Is there any chance you are pregnant?" "When was your last menstrual period?"       na  Protocols used: Bee or Yellow Jacket Sting-A-AH

## 2021-01-30 NOTE — Telephone Encounter (Signed)
Apt at [place] family today 01/30/2021 at 3pm  Thanks,   -Mickel Baas

## 2021-02-19 ENCOUNTER — Other Ambulatory Visit: Payer: Self-pay | Admitting: Family Medicine

## 2021-02-19 DIAGNOSIS — M62838 Other muscle spasm: Secondary | ICD-10-CM

## 2021-02-19 NOTE — Telephone Encounter (Signed)
Requested medication (s) are due for refill today: no  Requested medication (s) are on the active medication list: yes  Last refill:  12/24/20 #90 1 RF  Future visit scheduled: no  Notes to clinic:  med not delegated to NT to Refuse or refill   Requested Prescriptions  Pending Prescriptions Disp Refills   methocarbamol (ROBAXIN) 500 MG tablet [Pharmacy Med Name: METHOCARBAMOL 500 MG TABLET] 90 tablet 1    Sig: TAKE 1 TABLET BY MOUTH EVERY 8 HOURS AS NEEDED FOR MUSCLE SPASM     Not Delegated - Analgesics:  Muscle Relaxants Failed - 02/19/2021 10:28 AM      Failed - This refill cannot be delegated      Passed - Valid encounter within last 6 months    Recent Outpatient Visits           2 weeks ago Allergic angioedema, initial encounter   [place], [place]   5 months ago Chronic narcotic use   [facility] [place], [place]   9 months ago Annual physical exam   [facility], Cle[place]ce Sorrel, [place]   1 year ago Cough   [facility] [place], Cle[place]ce Sorrel, [place]   2 years ago Cough   [facility] [place] Malling M, [place]

## 2021-02-20 ENCOUNTER — Other Ambulatory Visit: Payer: Self-pay | Admitting: Physician Assistant

## 2021-02-20 DIAGNOSIS — F419 Anxiety disorder, unspecified: Secondary | ICD-10-CM

## 2021-02-23 ENCOUNTER — Other Ambulatory Visit: Payer: Self-pay | Admitting: Family Medicine

## 2021-02-23 ENCOUNTER — Telehealth: Payer: Self-pay

## 2021-02-23 ENCOUNTER — Other Ambulatory Visit: Payer: Self-pay | Admitting: Physician Assistant

## 2021-02-23 DIAGNOSIS — Z6839 Body mass index (BMI) 39.0-39.9, adult: Secondary | ICD-10-CM

## 2021-02-23 DIAGNOSIS — M25532 Pain in left wrist: Secondary | ICD-10-CM

## 2021-02-23 MED ORDER — OXYCODONE HCL 5 MG PO TABS: 5.0000 mg | ORAL_TABLET | Freq: Three times a day (TID) | ORAL | 0 refills | Status: DC | PRN

## 2021-02-23 NOTE — Telephone Encounter (Signed)
Copied from [place] [phone]. Topic: General - Other >> Feb 23, 2021  1:04 PM Yvette Rack wrote: Reason for CRM: Pt stated he was with Dr. Rosanna Randy prior to switching to [facility] and he would prefer to go back with Dr. Rosanna Randy.

## 2021-02-23 NOTE — Telephone Encounter (Signed)
Copied from [place] [phone]. Topic: Quick Communication - Rx Refill/Question >> Feb 23, 2021 12:56 PM Yvette Rack wrote: Medication: oxyCODONE (OXY IR/ROXICODONE) 5 MG immediate release tablet  Has the patient contacted their pharmacy? Yes.   (Agent: If no, request that the patient contact the pharmacy for the refill.) (Agent: If yes, when and what did the pharmacy advise?)  Preferred Pharmacy (with phone number or street name): [id number] [address]  Phone: [phone]: [phone] Agent: Please be advised that RX refills may take up to 3 business days. We ask that you follow-up with your pharmacy.

## 2021-02-23 NOTE — Telephone Encounter (Signed)
Requested medication (s) are due for refill today: no  Requested medication (s) are on the active medication list: yes   Last refill:  12/23/2020  Future visit scheduled: yes   Notes to clinic:  this refill cannot be delegated    Requested Prescriptions  Pending Prescriptions Disp Refills   oxyCODONE (OXY IR/ROXICODONE) 5 MG immediate release tablet 60 tablet 0    Sig: Take 1 tablet (5 mg total) by mouth every 8 (eight) hours as needed for severe pain.     Not Delegated - Analgesics:  Opioid Agonists Failed - 02/23/2021  1:17 PM      Failed - This refill cannot be delegated      Failed - Urine Drug Screen completed in last 360 days      Passed - Valid encounter within last 6 months    Recent Outpatient Visits           3 weeks ago Allergic angioedema, initial encounter   [place], [place]   5 months ago Chronic narcotic use   [facility] [place] Malling M, [place]   10 months ago Annual physical exam   [place], Clearn[place] Sorrel, [place]   1 year ago Cough   [facility] [place], Clearnce Sorrel, [place]   2 years ago Cough   [place], Clearn[place] Sorrel, [place]       Future Appointments             In 4 months Jerrol Banana., MD [facility], [place]

## 2021-02-23 NOTE — Telephone Encounter (Signed)
Please review.  It it okay for Francisco Oneal to switch back to you?  Thanks,   -Mickel Baas

## 2021-02-27 NOTE — Telephone Encounter (Signed)
Rx was sent to pharmacy.

## 2021-04-05 ENCOUNTER — Other Ambulatory Visit: Payer: Self-pay | Admitting: Family Medicine

## 2021-04-05 DIAGNOSIS — M25532 Pain in left wrist: Secondary | ICD-10-CM

## 2021-04-06 NOTE — Telephone Encounter (Signed)
Please advise refill?  LOV: 09/21/2020 NOV: 07/17/2021 Last refill: 02/23/2021 #60 w/0 refills

## 2021-04-08 ENCOUNTER — Encounter: Payer: Self-pay | Admitting: Family Medicine

## 2021-04-16 ENCOUNTER — Other Ambulatory Visit: Payer: Self-pay | Admitting: Family Medicine

## 2021-04-16 DIAGNOSIS — M62838 Other muscle spasm: Secondary | ICD-10-CM

## 2021-04-19 ENCOUNTER — Ambulatory Visit (INDEPENDENT_AMBULATORY_CARE_PROVIDER_SITE_OTHER): Payer: 59 | Admitting: Family Medicine

## 2021-04-19 ENCOUNTER — Other Ambulatory Visit: Payer: Self-pay

## 2021-04-19 VITALS — BP 143/91 | HR 91 | Temp 98.0°F | Wt 313.0 lb

## 2021-04-19 DIAGNOSIS — F1190 Opioid use, unspecified, uncomplicated: Secondary | ICD-10-CM | POA: Diagnosis not present

## 2021-04-19 DIAGNOSIS — Z23 Encounter for immunization: Secondary | ICD-10-CM | POA: Diagnosis not present

## 2021-04-19 DIAGNOSIS — G8929 Other chronic pain: Secondary | ICD-10-CM | POA: Diagnosis not present

## 2021-04-19 DIAGNOSIS — Z6839 Body mass index (BMI) 39.0-39.9, adult: Secondary | ICD-10-CM

## 2021-04-19 MED ORDER — GABAPENTIN 100 MG PO CAPS: 100.0000 mg | ORAL_CAPSULE | Freq: Every day | ORAL | 3 refills | Status: DC
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
## 2021-05-11 ENCOUNTER — Other Ambulatory Visit: Payer: Self-pay

## 2021-05-11 ENCOUNTER — Ambulatory Visit (INDEPENDENT_AMBULATORY_CARE_PROVIDER_SITE_OTHER): Payer: 59 | Admitting: Family Medicine

## 2021-05-11 ENCOUNTER — Encounter: Payer: Self-pay | Admitting: Family Medicine

## 2021-05-11 VITALS — BP 146/95 | HR 90 | Temp 97.5°F | Resp 16 | Ht 72.0 in | Wt 320.0 lb

## 2021-05-11 DIAGNOSIS — G2581 Restless legs syndrome: Secondary | ICD-10-CM

## 2021-05-11 DIAGNOSIS — Z125 Encounter for screening for malignant neoplasm of prostate: Secondary | ICD-10-CM

## 2021-05-11 DIAGNOSIS — E7800 Pure hypercholesterolemia, unspecified: Secondary | ICD-10-CM

## 2021-05-11 DIAGNOSIS — Z0000 Encounter for general adult medical examination without abnormal findings: Secondary | ICD-10-CM | POA: Diagnosis not present

## 2021-05-11 DIAGNOSIS — K219 Gastro-esophageal reflux disease without esophagitis: Secondary | ICD-10-CM | POA: Diagnosis not present

## 2021-05-11 DIAGNOSIS — Z6837 Body mass index (BMI) 37.0-37.9, adult: Secondary | ICD-10-CM

## 2021-05-11 DIAGNOSIS — R748 Abnormal levels of other serum enzymes: Secondary | ICD-10-CM

## 2021-05-11 MED ORDER — GABAPENTIN 300 MG PO CAPS: 300.0000 mg | ORAL_CAPSULE | Freq: Every day | ORAL | 5 refills | Status: DC

## 2021-05-11 NOTE — Patient Instructions (Signed)
Restart Liraglutide -Weight Management (SAXENDA).

## 2021-05-11 NOTE — Progress Notes (Signed)
I,April Miller,acting as a scribe for Wilhemena Durie, MD.,have documented all relevant documentation on the behalf of Wilhemena Durie, MD,as directed by  Wilhemena Durie, MD while in the presence of Wilhemena Durie, MD.   Complete physical exam   Patient: Francisco Oneal   DOB: 1971-03-28   50 y.o. Male  MRN: [id number] Visit Date: 05/11/2021  Today's healthcare provider: Wilhemena Durie, MD   Chief Complaint  Patient presents with   Annual Exam   Subjective    Francisco Oneal is a 50 y.o. male who presents today for a complete physical exam.  He reports consuming a general diet. The patient does not participate in regular exercise at present. He generally feels well. He reports sleeping fairly well. He does not have additional problems to discuss today.  HPI  He is tolerating the gabapentin fairly well for his right wrist pain and is still having some pain but it is tolerable without the oxycodone. History remarkable for father with CABG x6 at age 32 Patient also describes some restless leg syndrome.  History reviewed. No pertinent past medical history. Past Surgical History:  Procedure Laterality Date   APPENDECTOMY     INCISION AND DRAINAGE OF WOUND Left 12/08/2014   Procedure: IRRIGATION AND DEBRIDEMENT WOUND;  Surgeon: Corky Mull, MD;  Location: [facility] ORS;  Service: Orthopedics;  Laterality: Left;   OPEN REDUCTION INTERNAL FIXATION (ORIF) DISTAL RADIAL FRACTURE Left 12/08/2014   Procedure: OPEN REDUCTION INTERNAL FIXATION (ORIF) DISTAL RADIAL FRACTURE;  Surgeon: Corky Mull, MD;  Location: [facility] OR[facility];  Service: Orthopedics;  Laterality: Left;   Social History   Socioeconomic History   Marital status: Married    Spouse name: Not on file   Number of children: Not on file   Years of education: Not on file   Highest education level: Not on file  Occupational History   Not on file  Tobacco Use   Smoking status: Never   Smokeless tobacco: Never  Substance and  Sexual Activity   Alcohol use: Yes    Alcohol/week: 8.0 standard drinks    Types: 8 Glasses of wine per week   Drug use: Not on file   Sexual activity: Not on file  Other Topics Concern   Not on file  Social History Narrative   Not on file   Social Determinants of Health   Financial Resource Strain: Not on file  Food Insecurity: Not on file  Transportation Needs: Not on file  Physical Activity: Not on file  Stress: Not on file  Social Connections: Not on file  Intimate Partner Violence: Not on file   Family Status  Relation Name Status   Mother  Alive   Father  Alive   Son  Alive   Family History  Problem Relation Age of Onset   Emphysema Mother    Cancer Mother    Allergies  Allergen Reactions   Bee Venom Anaphylaxis   Penicillins Other (See Comments)    Has patient had a PCN reaction causing immediate rash, facial/tongue/throat swelling, SOB or lightheadedness with hypotension: Yes Has patient had a PCN reaction causing severe rash involving mucus membranes or skin necrosis: Yes Has patient had a PCN reaction that required hospitalization: Yes Has patient had a PCN reaction occurring within the last 10 years: No If all of the above answers are "NO", then may proceed with Cephalosporin use.     Patient Care Team: Jerrol Banana., MD as PCP -  General (Family Medicine)   Medications: Outpatient Medications Prior to Visit  Medication Sig   albuterol (VENTOLIN HFA) 108 (90 Base) MCG/ACT inhaler INHALE 1 PUFF INTO THE LUNGS EVERY 6 HOURS AS NEEDED FOR WHEEZING OR SHORTNESS OF BREATH.   ALPRAZolam (XANAX) 0.5 MG tablet TAKE 1/2 TO 1 TABLET BY MOUTH TWICE DAILY AS NEEDED FOR ANXIETY.   EPINEPHrine (EPIPEN 2-PAK) 0.3 mg/0.3 mL IJ SOAJ injection Inject 0.3 mg into the muscle as needed for anaphylaxis.   famotidine (PEPCID) 20 MG tablet Take 1 tablet (20 mg total) by mouth at bedtime.   fexofenadine (ALLEGRA ALLERGY) 180 MG tablet Take 1 tablet (180 mg total) by  mouth daily.   fluticasone furoate-vilanterol (BREO ELLIPTA) 200-25 MCG/INH AEPB Inhale 1 puff into the lungs daily.   Insulin Pen Needle (BD PEN NEEDLE NANO 2ND GEN) 32G X 4 MM MISC To use with saxenda injections   Liraglutide -Weight Management (SAXENDA) 18 MG/3ML SOPN Inject 3 mg into the skin once a week.   methocarbamol (ROBAXIN) 500 MG tablet TAKE 1 TABLET BY MOUTH EVERY 8 HOURS AS NEEDED FOR MUSCLE SPASMS   methylPREDNISolone (MEDROL DOSEPAK) 4 MG TBPK tablet Use as directed   pantoprazole (PROTONIX) 40 MG tablet TAKE 1 TABLET BY MOUTH DAILY. GENERIC EQUIVALENT FOR PROTONIX   VITAMIN D PO Take by mouth.   Vitamin D, Ergocalciferol, (DRISDOL) 1.25 MG (50000 UNIT) CAPS capsule TAKE 1 CAPSULE (50,000 UNITS TOTAL) BY MOUTH EVERY 7 (SEVEN) DAYS   [DISCONTINUED] gabapentin (NEURONTIN) 100 MG capsule Take 1 capsule (100 mg total) by mouth at bedtime.   [DISCONTINUED] oxyCODONE (OXY IR/ROXICODONE) 5 MG immediate release tablet Take 1 tablet (5 mg total) by mouth every 8 (eight) hours as needed for severe pain.   No facility-administered medications prior to visit.    Review of Systems  All other systems reviewed and are negative.     Objective    BP (!) 146/95 (BP Location: Right Arm, Patient Position: Sitting, Cuff Size: Large)   Pulse 90   Temp (!) 97.5 F (36.4 C) (Oral)   Resp 16   Ht 6' (1.829 m)   Wt (!) 320 lb (145.2 kg)   SpO2 95%   BMI 43.40 kg/m  BP Readings from Last 3 Encounters:  05/11/21 (!) 146/95  04/19/21 (!) 143/91  01/30/21 130/87   Wt Readings from Last 3 Encounters:  05/11/21 (!) 320 lb (145.2 kg)  04/19/21 (!) 313 lb (142 kg)  01/30/21 (!) 307 lb (139.3 kg)      Physical Exam Vitals reviewed.  Constitutional:      General: He is not in acute distress.    Appearance: Normal appearance. He is well-developed. He is obese. He is not ill-appearing.  HENT:     Head: Normocephalic and atraumatic.     Right Ear: Tympanic membrane, ear canal and external  ear normal.     Left Ear: Tympanic membrane, ear canal and external ear normal.     Nose: Nose normal.     Mouth/Throat:     Mouth: Mucous membranes are moist.     Pharynx: Oropharynx is clear. No oropharyngeal exudate.  Eyes:     General: No scleral icterus.       Right eye: No discharge.        Left eye: No discharge.     Extraocular Movements: Extraocular movements intact.     Conjunctiva/sclera: Conjunctivae normal.     Pupils: Pupils are equal, round, and reactive to light.  Neck:     Thyroid: No thyromegaly.     Vascular: No carotid bruit.     Trachea: No tracheal deviation.  Cardiovascular:     Rate and Rhythm: Normal rate and regular rhythm.     Pulses: Normal pulses.     Heart sounds: Normal heart sounds. No murmur heard. Pulmonary:     Effort: Pulmonary effort is normal. No respiratory distress.     Breath sounds: Normal breath sounds. No wheezing or rales.  Chest:     Chest wall: No tenderness.  Abdominal:     General: Abdomen is flat. Bowel sounds are normal. There is no distension.     Palpations: Abdomen is soft. There is no mass.     Tenderness: There is no abdominal tenderness. There is no guarding or rebound.  Musculoskeletal:        General: No tenderness.     Cervical back: Normal range of motion and neck supple. No tenderness.     Right lower leg: No edema.     Left lower leg: No edema.  Lymphadenopathy:     Cervical: No cervical adenopathy.  Skin:    General: Skin is warm and dry.     Capillary Refill: Capillary refill takes less than 2 seconds.     Findings: No erythema or rash.  Neurological:     General: No focal deficit present.     Mental Status: He is alert and oriented to person, place, and time.     Cranial Nerves: No cranial nerve deficit.     Motor: No abnormal muscle tone.     Coordination: Coordination normal.     Deep Tendon Reflexes: Reflexes are normal and symmetric. Reflexes normal.  Psychiatric:        Mood and Affect: Mood  normal.        Behavior: Behavior normal.        Thought Content: Thought content normal.        Judgment: Judgment normal.      Last depression screening scores PHQ 2/9 Scores 05/11/2021 01/30/2021 09/21/2020  PHQ - 2 Score 0 0 0  PHQ- 9 Score 0 - 2   Last fall risk screening Fall Risk  05/11/2021  Falls in the past year? 0  Number falls in past yr: 0  Injury with Fall? 0  Risk for fall due to : No Fall Risks  Follow up Falls evaluation completed   Last Audit-C alcohol use screening Alcohol Use Disorder Test (AUDIT) 05/11/2021  1. How often do you have a drink containing alcohol? 2  2. How many drinks containing alcohol do you have on a typical day when you are drinking? 1  3. How often do you have six or more drinks on one occasion? 1  AUDIT-C Score 4  4. How often during the last year have you found that you were not able to stop drinking once you had started? 0  5. How often during the last year have you failed to do what was normally expected from you because of drinking? 0  6. How often during the last year have you needed a first drink in the morning to get yourself going after a heavy drinking session? 0  7. How often during the last year have you had a feeling of guilt of remorse after drinking? 0  8. How often during the last year have you been unable to remember what happened the night before because you had been drinking? 0  9.  Have you or someone else been injured as a result of your drinking? 0  10. Has a relative or friend or a doctor or another health worker been concerned about your drinking or suggested you cut down? 0  Alcohol Use Disorder Identification Test Final Score (AUDIT) 4  Alcohol Brief Interventions/Follow-up -   A score of 3 or more in women, and 4 or more in men indicates increased risk for alcohol abuse, EXCEPT if all of the points are from question 1   No results found for any visits on 05/11/21.  Assessment & Plan    Routine Health Maintenance and  Physical Exam  Exercise Activities and Dietary recommendations  Goals   None     Immunization History  Administered Date(s) Administered   Influenza,inj,Quad PF,6+ Mos 04/09/2016, 06/13/2018, 04/29/2020, 04/19/2021   Influenza-Unspecified 02/25/2019, 04/29/2020   Moderna Sars-Covid-2 Vaccination 10/08/2019, 11/05/2019, 06/24/2020   Td 11/27/2012   Tdap 11/27/2012    Health Maintenance  Topic Date Due   Zoster Vaccines- Shingrix (1 of 2) Never done   COVID-19 Vaccine (4 - Booster for Moderna series) 08/19/2020   TETANUS/TDAP  11/28/2022   Fecal DNA (Cologuard)  05/07/2023   INFLUENZA VACCINE  Completed   Hepatitis C Screening  Completed   HIV Screening  Completed   Pneumococcal Vaccine 97-17 Years old  Aged Out   HPV VACCINES  Aged Out    Discussed health benefits of physical activity, and encouraged him to engage in regular exercise appropriate for his age and condition.  1. Annual physical exam  - Lipid panel - TSH - CBC w/Diff/Platelet - Comprehensive Metabolic Panel (CMET)  2. Hypercholesterolemia Consider treating with father with heart disease - Lipid panel - TSH - CBC w/Diff/Platelet - Comprehensive Metabolic Panel (CMET)  3. Class 2 severe obesity due to excess calories with serious comorbidity and body mass index (BMI) of 37.0 to 37.9 in adult [facility]) For diet and exercise discussed at length. - Lipid panel - TSH - CBC w/Diff/Platelet - Comprehensive Metabolic Panel (CMET)  4. Gastroesophageal reflux disease without esophagitis  - Lipid panel - TSH - CBC w/Diff/Platelet - Comprehensive Metabolic Panel (CMET)  5. Elevated liver enzymes  - Lipid panel - TSH - CBC w/Diff/Platelet - Comprehensive Metabolic Panel (CMET)  6. Prostate cancer screening  - PSA  7. Restless leg Gabapentin may help this RLS description - Iron, TIBC and Ferritin Panel - gabapentin (NEURONTIN) 300 MG capsule; Take 1 capsule (300 mg total) by mouth at bedtime.   Dispense: 30 capsule; Refill: 5 8.  Chronic wrist pain Increase gabapentin to 300 mg at bedtime but avoid aquatics.  Return in about 4 months (around 09/08/2021).     I, Wilhemena Durie, MD, have reviewed all documentation for this visit. The documentation on 05/17/21 for the exam, diagnosis, procedures, and orders are all accurate and complete.    Francisco Vinton Cranford Mon, MD  [facility] [phone] (phone) [phone] (fax)  [place]

## 2021-05-12 LAB — IRON,TIBC AND FERRITIN PANEL
Ferritin: 270 ng/mL (ref 30–400)
Iron Saturation: 28 % (ref 15–55)
Iron: 97 ug/dL (ref 38–169)
Total Iron Binding Capacity: 343 ug/dL (ref 250–450)
UIBC: 246 ug/dL (ref 111–343)

## 2021-05-12 LAB — COMPREHENSIVE METABOLIC PANEL
ALT: 42 IU/L (ref 0–44)
AST: 32 IU/L (ref 0–40)
Albumin/Globulin Ratio: 1.8 (ref 1.2–2.2)
Albumin: 4.8 g/dL (ref 3.8–4.9)
Alkaline Phosphatase: 56 IU/L (ref 44–121)
BUN/Creatinine Ratio: 15 (ref 9–20)
BUN: 17 mg/dL (ref 6–24)
Bilirubin Total: 0.3 mg/dL (ref 0.0–1.2)
CO2: 21 mmol/L (ref 20–29)
Calcium: 9.6 mg/dL (ref 8.7–10.2)
Chloride: 99 mmol/L (ref 96–106)
Creatinine, Ser: 1.12 mg/dL (ref 0.76–1.27)
Globulin, Total: 2.6 g/dL (ref 1.5–4.5)
Glucose: 83 mg/dL (ref 70–99)
Potassium: 4.3 mmol/L (ref 3.5–5.2)
Sodium: 141 mmol/L (ref 134–144)
Total Protein: 7.4 g/dL (ref 6.0–8.5)
eGFR: 80 mL/min/{1.73_m2} (ref >59)

## 2021-05-12 LAB — CBC WITH DIFFERENTIAL/PLATELET
Basophils Absolute: 0 10*3/uL (ref 0.0–0.2)
Basos: 0 %
EOS (ABSOLUTE): 0.1 10*3/uL (ref 0.0–0.4)
Eos: 1 %
Hematocrit: 48 % (ref 37.5–51.0)
Hemoglobin: 16.2 g/dL (ref 13.0–17.7)
Immature Grans (Abs): 0.1 10*3/uL (ref 0.0–0.1)
Immature Granulocytes: 1 %
Lymphocytes Absolute: 3.5 10*3/uL — ABNORMAL HIGH (ref 0.7–3.1)
Lymphs: 35 %
MCH: 31.2 pg (ref 26.6–33.0)
MCHC: 33.8 g/dL (ref 31.5–35.7)
MCV: 93 fL (ref 79–97)
Monocytes Absolute: 0.9 10*3/uL (ref 0.1–0.9)
Monocytes: 9 %
Neutrophils Absolute: 5.3 10*3/uL (ref 1.4–7.0)
Neutrophils: 54 %
Platelets: 252 10*3/uL (ref 150–450)
RBC: 5.19 x10E6/uL (ref 4.14–5.80)
RDW: 11.9 % (ref 11.6–15.4)
WBC: 9.9 10*3/uL (ref 3.4–10.8)

## 2021-05-12 LAB — LIPID PANEL
Chol/HDL Ratio: 5.1 ratio — ABNORMAL HIGH (ref 0.0–5.0)
Cholesterol, Total: 249 mg/dL — ABNORMAL HIGH (ref 100–199)
HDL: 49 mg/dL (ref >39)
LDL Chol Calc (NIH): 153 mg/dL — ABNORMAL HIGH (ref 0–99)
Triglycerides: 254 mg/dL — ABNORMAL HIGH (ref 0–149)
VLDL Cholesterol Cal: 47 mg/dL — ABNORMAL HIGH (ref 5–40)

## 2021-05-12 LAB — TSH: TSH: 3.61 u[IU]/mL (ref 0.450–4.500)

## 2021-05-12 LAB — PSA: Prostate Specific Ag, Serum: 0.5 ng/mL (ref 0.0–4.0)

## 2021-05-14 ENCOUNTER — Other Ambulatory Visit: Payer: Self-pay | Admitting: Family Medicine

## 2021-05-14 DIAGNOSIS — M62838 Other muscle spasm: Secondary | ICD-10-CM

## 2021-05-14 NOTE — Telephone Encounter (Signed)
Requested medication (s) are due for refill today: yes  Requested medication (s) are on the active medication list: yes  Last refill:  04/16/21 #90  Future visit scheduled: yes  Notes to clinic:  med  not delegated to NT to RF   Requested Prescriptions  Pending Prescriptions Disp Refills   methocarbamol (ROBAXIN) 500 MG tablet [Pharmacy Med Name: METHOCARBAMOL 500 MG TABLET] 90 tablet 0    Sig: TAKE 1 TABLET BY MOUTH EVERY 8 HOURS AS NEEDED FOR MUSCLE SPASM     Not Delegated - Analgesics:  Muscle Relaxants Failed - 05/14/2021 11:06 AM      Failed - This refill cannot be delegated      Passed - Valid encounter within last 6 months    Recent Outpatient Visits           3 days ago Annual physical exam   [facility] Jerrol Banana., MD   3 weeks ago Other chronic pain   [facility] Jerrol Banana., MD   3 months ago Allergic angioedema, initial encounter   [place] Vigg, Avanti, MD   7 months ago Chronic narcotic use   [facility] [place], Clearnce Sorrel, [place]   1 year ago Annual physical exam   [facility] [place], Clearnce Sorrel, [place]       Future Appointments             In 4 months Jerrol Banana., MD [facility] [place], [place]

## 2021-06-06 ENCOUNTER — Other Ambulatory Visit: Payer: Self-pay | Admitting: Internal Medicine

## 2021-06-06 ENCOUNTER — Other Ambulatory Visit: Payer: Self-pay | Admitting: Family Medicine

## 2021-06-06 DIAGNOSIS — K219 Gastro-esophageal reflux disease without esophagitis: Secondary | ICD-10-CM

## 2021-06-08 NOTE — Telephone Encounter (Signed)
Requested Prescriptions  Pending Prescriptions Disp Refills  . EPINEPHRINE 0.3 mg/0.3 mL IJ SOAJ injection [Pharmacy Med Name: EPINEPHRINE 0.3 MG AUTO-INJECT] 2 each 1    Sig: INJECT 0.3 MG INTO THE MUSCLE AS NEEDED FOR ANAPHYLAXIS.     Immunology: Antidotes Passed - 06/06/2021  6:08 PM      Passed - Valid encounter within last 12 months    Recent Outpatient Visits          4 weeks ago Annual physical exam   [facility] Jerrol Banana., MD   1 month ago Other chronic pain   [facility] Jerrol Banana., MD   4 months ago Allergic angioedema, initial encounter   [facility] Vigg, Avanti, MD   8 months ago Chronic narcotic use   [facility] [place], Clearnce Sorrel, [place]   1 year ago Annual physical exam   [place], Clearnce Sorrel, [place]      Future Appointments            In 3 months Jerrol Banana., MD [facility], [place]

## 2021-07-06 ENCOUNTER — Other Ambulatory Visit: Payer: Self-pay | Admitting: Family Medicine

## 2021-07-06 DIAGNOSIS — E559 Vitamin D deficiency, unspecified: Secondary | ICD-10-CM

## 2021-07-06 NOTE — Telephone Encounter (Signed)
Requested medications are due for refill today.  yes  Requested medications are on the active medications list.  yes  Last refill. 01/19/2021  Future visit scheduled.   yes  Notes to clinic.  Medication not delegated.    Requested Prescriptions  Pending Prescriptions Disp Refills   Vitamin D, Ergocalciferol, (DRISDOL) 1.25 MG (50000 UNIT) CAPS capsule [Pharmacy Med Name: VITAMIN D2 1.25MG(50,000 UNIT)] 12 capsule 1    Sig: TAKE 1 CAPSULE (50,000 UNITS TOTAL) BY MOUTH EVERY 7 (SEVEN) DAYS     Endocrinology:  Vitamins - Vitamin D Supplementation Failed - 07/06/2021  1:35 AM      Failed - 50,000 IU strengths are not delegated      Failed - Phosphate in normal range and within 360 days    No results found for: PHOS        Failed - Vitamin D in normal range and within 360 days    Vit D, 25-Hydroxy  Date Value Ref Range Status  09/25/2019 50.3 30.0 - 100.0 ng/mL Final    Comment:    Vitamin D deficiency has been defined by the Institute of Medicine and an Endocrine Society practice guideline as a level of serum 25-OH vitamin D less than 20 ng/mL (1,2). The Endocrine Society went on to further define vitamin D insufficiency as a level between 21 and 29 ng/mL (2). 1. IOM (Institute of Medicine). 2010. Dietary reference    intakes for calcium and D. [place]: The    [employer]. 2. Holick MF, Binkley Rose[place], Bischoff-Ferrari HA, et al.    Evaluation, treatment, and prevention of vitamin D    deficiency: an Endocrine Society clinical practice    guideline. JCEM. 2011 Jul; 96(7):1911-30.           Passed - Ca in normal range and within 360 days    Calcium  Date Value Ref Range Status  05/11/2021 9.6 8.7 - 10.2 mg/dL Final   Calcium, Total  Date Value Ref Range Status  06/07/2013 9.4 8.5 - 10.1 mg/dL Final          Passed - Valid encounter within last 12 months    Recent Outpatient Visits           1 month ago Annual physical exam   [facility] Jerrol Banana., MD   2 months ago Other chronic pain   [facility] Jerrol Banana., MD   5 months ago Allergic angioedema, initial encounter   [place] Vigg, Avanti, MD   9 months ago Chronic narcotic use   [facility] [place], Clearnce Sorrel, [place]   1 year ago Annual physical exam   [place], Clearnce Sorrel, [place]       Future Appointments             In 2 months Jerrol Banana., MD [facility], [place]

## 2021-07-17 ENCOUNTER — Encounter: Payer: 59 | Admitting: Family Medicine

## 2021-07-18 ENCOUNTER — Other Ambulatory Visit: Payer: Self-pay | Admitting: Internal Medicine

## 2021-07-18 NOTE — Telephone Encounter (Signed)
Requested medication (s) are due for refill today: yes  Requested medication (s) are on the active medication list: yes  Last refill:  01/30/21  Future visit scheduled: no  Notes to clinic:  prescriber not at this practice, prescriber at [facility], please assess.  Requested Prescriptions  Pending Prescriptions Disp Refills   methylPREDNISolone (MEDROL DOSEPAK) 4 MG TBPK tablet [Pharmacy Med Name: METHYLPREDNISOLONE 4 MG DOSEPK] 21 each     Sig: TAKE 6 TABLETS ON DAY 1 AS DIRECTED ON PACKAGE AND DECREASE BY 1 TAB EACH DAY FOR A TOTAL OF 6 DAYS     Not Delegated - Endocrinology:  Oral Corticosteroids Failed - 07/18/2021  6:25 AM      Failed - This refill cannot be delegated      Failed - Last BP in normal range    BP Readings from Last 1 Encounters:  05/11/21 (!) 146/95          Passed - Valid encounter within last 6 months    Recent Outpatient Visits           2 months ago Annual physical exam   [facility] Jerrol Banana., MD   3 months ago Other chronic pain   [facility] Jerrol Banana., MD   5 months ago Allergic angioedema, initial encounter   [place] Vigg, Avanti, MD   10 months ago Chronic narcotic use   [facility] [place], Clearnce Sorrel, [place]   1 year ago Annual physical exam   [place], Clearnce Sorrel, [place]       Future Appointments             In 1 month Jerrol Banana., MD [facility]             fexofenadine (ALLEGRA) 180 MG tablet [Pharmacy Med Name: FEXOFENADINE HCL 180 MG TABLET] 90 tablet 0    Sig: TAKE 1 TABLET BY MOUTH EVERY DAY     Ear, Nose, and Throat:  Antihistamines Passed - 07/18/2021  6:25 AM      Passed - Valid encounter within last 12 months    Recent Outpatient Visits           2 months ago Annual physical exam   [facility] Jerrol Banana., MD   3 months ago Other chronic pain    [facility] Jerrol Banana., MD   5 months ago Allergic angioedema, initial encounter   [place] Vigg, Avanti, MD   10 months ago Chronic narcotic use   [facility] [place], Clearnce Sorrel, [place]   1 year ago Annual physical exam   [place], Clearnce Sorrel, [place]       Future Appointments             In 1 month Jerrol Banana., MD [facility], [facility]             famotidine (PEPCID) 20 MG tablet [Pharmacy Med Name: FAMOTIDINE 20 MG TABLET] 90 tablet 0    Sig: TAKE 1 TABLET BY MOUTH EVERYDAY AT BEDTIME     Gastroenterology:  H2 Antagonists Passed - 07/18/2021  6:25 AM      Passed - Valid encounter within last 12 months    Recent Outpatient Visits           2 months ago Annual physical exam   [place],  Retia Passe., MD   3 months ago Other chronic pain   [facility] Jerrol Banana., MD   5 months ago Allergic angioedema, initial encounter   [facility] Vigg, Avanti, MD   10 months ago Chronic narcotic use   [facility] [place], Clearnce Sorrel, [place]   1 year ago Annual physical exam   [place], Clearnce Sorrel, [place]       Future Appointments             In 1 month Jerrol Banana., MD [facility], [place]

## 2021-08-01 ENCOUNTER — Other Ambulatory Visit: Payer: Self-pay | Admitting: Family Medicine

## 2021-08-01 DIAGNOSIS — K219 Gastro-esophageal reflux disease without esophagitis: Secondary | ICD-10-CM

## 2021-08-01 DIAGNOSIS — M62838 Other muscle spasm: Secondary | ICD-10-CM

## 2021-08-01 NOTE — Telephone Encounter (Signed)
[facility] faxed refill request for the following medications:  methocarbamol (ROBAXIN) 500 MG tablet   pantoprazole (PROTONIX) 40 MG tablet   Please advise.

## 2021-08-02 MED ORDER — METHOCARBAMOL 500 MG PO TABS: ORAL_TABLET | ORAL | 0 refills | Status: DC

## 2021-08-02 MED ORDER — PANTOPRAZOLE SODIUM 40 MG PO TBEC: 40.0000 mg | DELAYED_RELEASE_TABLET | Freq: Every day | ORAL | 1 refills | Status: DC

## 2021-08-02 NOTE — Telephone Encounter (Signed)
Please advise resending rx for methocarbamol to [facility]?

## 2021-08-03 ENCOUNTER — Telehealth: Payer: Self-pay | Admitting: Family Medicine

## 2021-08-03 NOTE — Telephone Encounter (Signed)
RF request too early

## 2021-08-03 NOTE — Telephone Encounter (Signed)
[facility] faxed refill request for the following medications:  gabapentin (NEURONTIN) 300 MG capsule   Please advise.

## 2021-08-20 ENCOUNTER — Other Ambulatory Visit: Payer: Self-pay | Admitting: Family Medicine

## 2021-08-20 DIAGNOSIS — E559 Vitamin D deficiency, unspecified: Secondary | ICD-10-CM

## 2021-08-20 DIAGNOSIS — F419 Anxiety disorder, unspecified: Secondary | ICD-10-CM

## 2021-08-21 NOTE — Telephone Encounter (Signed)
Requested medications are due for refill today.  unsure  Requested medications are on the active medications list.  yes  Last refill. 06/08/2021 2 1 refill  Future visit scheduled.   yes  Notes to clinic.  Rx refilled 06/08/2021 2 pens with 1 refill. Please review.    Requested Prescriptions  Pending Prescriptions Disp Refills   EPINEPHRINE 0.3 mg/0.3 mL IJ SOAJ injection [Pharmacy Med Name: EPINEPHRINE 0.3 MG AUTO-INJECT] 2 each 1    Sig: INJECT 0.3 MG INTO THE MUSCLE AS NEEDED FOR ANAPHYLAXIS.     Immunology: Antidotes Passed - 08/20/2021  1:24 PM      Passed - Valid encounter within last 12 months    Recent Outpatient Visits           3 months ago Annual physical exam   [facility] Jerrol Banana., MD   4 months ago Other chronic pain   [facility] Jerrol Banana., MD   6 months ago Allergic angioedema, initial encounter   [facility] Vigg, Avanti, MD   11 months ago Chronic narcotic use   [facility] [place], Clearnce Sorrel, [place]   1 year ago Annual physical exam   [place], Clearnce Sorrel, [place]       Future Appointments             In 3 weeks Jerrol Banana., MD [facility], [place]

## 2021-08-21 NOTE — Telephone Encounter (Signed)
Please advise refill?  LOV: 05/11/2021 NOV: 09/14/2021 Last refill: 02/21/2021 #180 w/1 refill

## 2021-08-23 ENCOUNTER — Telehealth: Payer: Self-pay | Admitting: Family Medicine

## 2021-08-23 DIAGNOSIS — G2581 Restless legs syndrome: Secondary | ICD-10-CM

## 2021-08-23 MED ORDER — GABAPENTIN 300 MG PO CAPS: 300.0000 mg | ORAL_CAPSULE | Freq: Every day | ORAL | 1 refills | Status: DC

## 2021-08-23 MED ORDER — GABAPENTIN 300 MG PO CAPS: 300.0000 mg | ORAL_CAPSULE | Freq: Every day | ORAL | 5 refills | Status: DC

## 2021-08-23 NOTE — Telephone Encounter (Signed)
Rx was sent to pharmacy.

## 2021-08-23 NOTE — Telephone Encounter (Signed)
[facility] faxed refill request for the following medications:  gabapentin (NEURONTIN) 300 MG capsule   Please advise

## 2021-09-14 ENCOUNTER — Ambulatory Visit: Payer: 59 | Admitting: Family Medicine

## 2021-09-26 ENCOUNTER — Telehealth: Payer: Self-pay

## 2021-09-26 NOTE — Telephone Encounter (Signed)
Copied from [place] [phone]. Topic: General - Other ?>> Sep 26, 2021 11:26 AM Greggory Keen D wrote: ?Reason for CRM: Pt's wife called saying they got a bill for a missed visit date of 09/14/2021.  They had an emergency and she called the afternoon of the 8th.  She was told there would not be a charge because she called before 5.  She said she also rescheduled the appt at the same time.  She does not feel they should be billed for a no show ?CB#  [phone] ?

## 2021-09-29 NOTE — Telephone Encounter (Signed)
Contacted the patient and explained the bill was from previous visit in October.

## 2021-10-14 ENCOUNTER — Other Ambulatory Visit: Payer: Self-pay | Admitting: Family Medicine

## 2021-10-16 ENCOUNTER — Other Ambulatory Visit: Payer: Self-pay | Admitting: Family Medicine

## 2021-10-16 DIAGNOSIS — M62838 Other muscle spasm: Secondary | ICD-10-CM

## 2021-10-16 NOTE — Telephone Encounter (Signed)
Requested Prescriptions  ?Pending Prescriptions Disp Refills  ?? famotidine (PEPCID) 20 MG tablet [Pharmacy Med Name: FAMOTIDINE 20 MG TABLET] 90 tablet 1  ?  Sig: TAKE 1 TABLET BY MOUTH EVERYDAY AT BEDTIME  ?  ? Gastroenterology:  H2 Antagonists Passed - 10/14/2021  8:58 AM  ?  ?  Passed - Valid encounter within last 12 months  ?  Recent Outpatient Visits   ?      ? 5 months ago Annual physical exam  ? [facility] Jer[facility]ana., MD  ? 6 months ago Other chronic pain  ? [facility] Jerrol Banana., MD  ? 8 months ago Allergic angioedema, initial encounter  ? [facility] Vigg, Avanti, MD  ? 1 year ago Chronic narcotic use  ? [facility] [place], Clearnce Sorrel, [place]  ? 1 year ago Annual physical exam  ? [facility] [place], [place]  ?  ?  ?Future Appointments   ?        ? In 4 weeks Jerrol Banana., MD [facility]  ?  ? ?  ?  ?  ? ?

## 2021-11-14 ENCOUNTER — Encounter: Payer: Self-pay | Admitting: Family Medicine

## 2021-11-14 ENCOUNTER — Ambulatory Visit: Payer: 59 | Admitting: Family Medicine

## 2021-11-14 VITALS — BP 145/94 | HR 77 | Temp 97.7°F | Resp 16 | Wt 323.0 lb

## 2021-11-14 DIAGNOSIS — J301 Allergic rhinitis due to pollen: Secondary | ICD-10-CM

## 2021-11-14 DIAGNOSIS — E661 Drug-induced obesity: Secondary | ICD-10-CM | POA: Diagnosis not present

## 2021-11-14 DIAGNOSIS — G8929 Other chronic pain: Secondary | ICD-10-CM

## 2021-11-14 DIAGNOSIS — E7800 Pure hypercholesterolemia, unspecified: Secondary | ICD-10-CM

## 2021-11-14 DIAGNOSIS — Z6839 Body mass index (BMI) 39.0-39.9, adult: Secondary | ICD-10-CM

## 2021-11-14 DIAGNOSIS — R109 Unspecified abdominal pain: Secondary | ICD-10-CM

## 2021-11-14 DIAGNOSIS — F411 Generalized anxiety disorder: Secondary | ICD-10-CM

## 2021-11-14 DIAGNOSIS — M25532 Pain in left wrist: Secondary | ICD-10-CM

## 2021-11-14 LAB — POCT URINALYSIS DIPSTICK
Bilirubin, UA: NEGATIVE
Blood, UA: NEGATIVE
Glucose, UA: NEGATIVE
Ketones, UA: NEGATIVE
Leukocytes, UA: NEGATIVE
Nitrite, UA: NEGATIVE
Protein, UA: NEGATIVE
Spec Grav, UA: 1.01 (ref 1.010–1.025)
Urobilinogen, UA: 0.2 E.U./dL
pH, UA: 6 (ref 5.0–8.0)

## 2021-11-14 NOTE — Progress Notes (Signed)
?  ? ? ?Established patient visit ? ?I,April Miller,acting as a scribe for Wilhemena Durie, MD.,have documented all relevant documentation on the behalf of Wilhemena Durie, MD,as directed by  Wilhemena Durie, MD while in the presence of Wilhemena Durie, MD. ? ? ?Patient: Francisco Oneal   DOB: April 01, 1971   51 y.o. Male  MRN: [id number] ?Visit Date: 11/14/2021 ? ?Today's healthcare provider: Wilhemena Durie, MD  ? ?Chief Complaint  ?Patient presents with  ? Follow-up  ? Hyperlipidemia  ? Flank Pain  ? ?Subjective  ?  ?HPI  ?Patient comes in today for follow-up.  Probably feels well and his wrist is doing okay but he complains of ongoing right lower quadrant/flank pain that might be aggravated by movement.  No GI or GU symptoms and no hematuria.  It is intermittent and not constant.   ?Lipid/Cholesterol, Follow-up ? ?Last lipid panel Other pertinent labs  ?Lab Results  ?Component Value Date  ? CHOL 249 (H) 05/11/2021  ? HDL 49 05/11/2021  ? LDLCALC 153 (H) 05/11/2021  ? TRIG 254 (H) 05/11/2021  ? CHOLHDL 5.1 (H) 05/11/2021  ? Lab Results  ?Component Value Date  ? ALT 42 05/11/2021  ? AST 32 05/11/2021  ? PLT 252 05/11/2021  ? TSH 3.610 05/11/2021  ?  ? ?He was last seen for this 6 months ago.  ?Management since that visit includes; labs checked showing-stable. ? ?The 10-year ASCVD risk score (Arnett DK, et al., 2019) is: 6.5% ? ?--------------------------------------------------------------------------------------------------- ? ? ?Medications: ?Outpatient Medications Prior to Visit  ?Medication Sig  ? albuterol (VENTOLIN HFA) 108 (90 Base) MCG/ACT inhaler INHALE 1 PUFF INTO THE LUNGS EVERY 6 HOURS AS NEEDED FOR WHEEZING OR SHORTNESS OF BREATH.  ? ALPRAZolam (XANAX) 0.5 MG tablet TAKE 1/2 TO 1 TABLET BY MOUTH TWICE DAILY AS NEEDED FOR ANXIETY  ? EPINEPHRINE 0.3 mg/0.3 mL IJ SOAJ injection INJECT 0.3 MG INTO THE MUSCLE AS NEEDED FOR ANAPHYLAXIS.  ? famotidine (PEPCID) 20 MG tablet TAKE 1 TABLET BY MOUTH  EVERYDAY AT BEDTIME  ? fexofenadine (ALLEGRA ALLERGY) 180 MG tablet Take 1 tablet (180 mg total) by mouth daily.  ? fluticasone furoate-vilanterol (BREO ELLIPTA) 200-25 MCG/INH AEPB Inhale 1 puff into the lungs daily.  ? gabapentin (NEURONTIN) 300 MG capsule Take 1 capsule (300 mg total) by mouth at bedtime.  ? Insulin Pen Needle (BD PEN NEEDLE NANO 2ND GEN) 32G X 4 MM MISC To use with saxenda injections  ? Liraglutide -Weight Management (SAXENDA) 18 MG/3ML SOPN Inject 3 mg into the skin once a week.  ? methocarbamol (ROBAXIN) 500 MG tablet TAKE 1 TABLET BY MOUTH EVERY 8 HOURS AS NEEDED FOR MUSCLE SPASMS  ? methylPREDNISolone (MEDROL DOSEPAK) 4 MG TBPK tablet Use as directed  ? pantoprazole (PROTONIX) 40 MG tablet Take 1 tablet (40 mg total) by mouth daily.  ? VITAMIN D PO Take by mouth.  ? Vitamin D, Ergocalciferol, (DRISDOL) 1.25 MG (50000 UNIT) CAPS capsule TAKE 1 CAPSULE (50,000 UNITS TOTAL) BY MOUTH EVERY 7 (SEVEN) DAYS  ? ?No facility-administered medications prior to visit.  ? ? ?Review of Systems  ?Constitutional:  Negative for appetite change, chills and fever.  ?Respiratory:  Negative for chest tightness, shortness of breath and wheezing.   ?Cardiovascular:  Negative for chest pain and palpitations.  ?Gastrointestinal:  Negative for abdominal pain, nausea and vomiting.  ?Genitourinary:  Positive for flank pain.  ? ?Last hemoglobin A1c ?Lab Results  ?Component Value Date  ? HGBA1C 5.1 09/25/2019  ? ?  ?  Objective  ?  ?BP (!) 145/94 (BP Location: Right Arm, Patient Position: Sitting, Cuff Size: Large)   Pulse 77   Temp 97.7 ?F (36.5 ?C) (Temporal)   Resp 16   Wt (!) 323 lb (146.5 kg)   SpO2 96%   BMI 43.81 kg/m?  ?BP Readings from Last 3 Encounters:  ?11/14/21 (!) 145/94  ?05/11/21 (!) 146/95  ?04/19/21 (!) 143/91  ? ?Wt Readings from Last 3 Encounters:  ?11/14/21 (!) 323 lb (146.5 kg)  ?05/11/21 (!) 320 lb (145.2 kg)  ?04/19/21 (!) 313 lb (142 kg)  ? ?  ? ?Physical Exam ?Vitals reviewed.   ?Constitutional:   ?   General: He is not in acute distress. ?   Appearance: He is well-developed.  ?HENT:  ?   Head: Normocephalic and atraumatic.  ?   Right Ear: Hearing normal.  ?   Left Ear: Hearing normal.  ?   Nose: Nose normal.  ?Eyes:  ?   General: Lids are normal. No scleral icterus.    ?   Right eye: No discharge.     ?   Left eye: No discharge.  ?   Conjunctiva/sclera: Conjunctivae normal.  ?Cardiovascular:  ?   Rate and Rhythm: Normal rate and regular rhythm.  ?   Heart sounds: Normal heart sounds.  ?Pulmonary:  ?   Effort: Pulmonary effort is normal. No respiratory distress.  ?Abdominal:  ?   General: There is no distension.  ?   Palpations: Abdomen is soft. There is no mass.  ?   Tenderness: There is no abdominal tenderness.  ?   Hernia: No hernia is present.  ?Skin: ?   Findings: No lesion or rash.  ?Neurological:  ?   General: No focal deficit present.  ?   Mental Status: He is alert and oriented to person, place, and time.  ?Psychiatric:     ?   Mood and Affect: Mood normal.     ?   Speech: Speech normal.     ?   Behavior: Behavior normal.     ?   Thought Content: Thought content normal.     ?   Judgment: Judgment normal.  ?  ? ? ?Results for orders placed or performed in visit on 11/14/21  ?POCT urinalysis dipstick  ?Result Value Ref Range  ? Color, UA Yellow   ? Clarity, UA Clear   ? Glucose, UA Negative Negative  ? Bilirubin, UA Negative   ? Ketones, UA Negative   ? Spec Grav, UA 1.010 1.010 - 1.025  ? Blood, UA Negative   ? pH, UA 6.0 5.0 - 8.0  ? Protein, UA Negative Negative  ? Urobilinogen, UA 0.2 0.2 or 1.0 E.U./dL  ? Nitrite, UA Negative   ? Leukocytes, UA Negative Negative  ? ? Assessment & Plan  ?  ? ?1. Right flank pain, chronic ?Distribution fits a kidney stone but the history does not.  UA is negative.  This time obtain a KUB followed by general abdominal ultrasound.  They need referral or further imaging .  He is to keep notes of [place] in duration and intensity of symptoms and see  back in 1 month. ?- DG Abd 1 View ?- US Abdomen Complete ?- POCT urinalysis dipstick ? ?2. Hypercholesterolemia ? ?- DG Abd 1 View ?- US Abdomen Complete ?- POCT urinalysis dipstick ? ?3. Class 2 drug-induced obesity with serious comorbidity and body mass index (BMI) of 39.0 to 39.9 in adult ?Diet and  exercise has been stressed. ? ?4. Seasonal allergic rhinitis due to pollen ? ? ?5. Left wrist pain ?Chronic but controlled with gabapentin. ? ?6. GAD (generalized anxiety disorder) ?Chronic issue for which she takes as needed Xanax.  Would consider an SSRI if this is persistent or worsening. ? ? ?Return in about 2 months (around 01/14/2022).  ?   ? ?I, Wilhemena Durie, MD, have reviewed all documentation for this visit. The documentation on 11/20/21 for the exam, diagnosis, procedures, and orders are all accurate and complete. ? ? ? ?Orvilla Truett Cranford Mon, MD  ?[facility] ?[phone] (phone) ?[phone] (fax) ? ?[facility] ?

## 2021-11-15 ENCOUNTER — Ambulatory Visit
Admission: RE | Admit: 2021-11-15 | Discharge: 2021-11-15 | Disposition: A | Discharge status: Home / Self Care | Payer: 59 | Source: Ambulatory Visit | Attending: Family Medicine | Admitting: Family Medicine

## 2021-11-15 DIAGNOSIS — R109 Unspecified abdominal pain: Secondary | ICD-10-CM | POA: Insufficient documentation

## 2021-11-15 DIAGNOSIS — G8929 Other chronic pain: Secondary | ICD-10-CM | POA: Insufficient documentation

## 2021-11-15 DIAGNOSIS — E7800 Pure hypercholesterolemia, unspecified: Secondary | ICD-10-CM | POA: Diagnosis present

## 2021-11-27 ENCOUNTER — Ambulatory Visit
Admission: RE | Admit: 2021-11-27 | Discharge: 2021-11-27 | Disposition: A | Discharge status: Home / Self Care | Payer: 59 | Source: Ambulatory Visit | Attending: Family Medicine | Admitting: Family Medicine

## 2021-11-27 DIAGNOSIS — R109 Unspecified abdominal pain: Secondary | ICD-10-CM | POA: Insufficient documentation

## 2021-11-27 DIAGNOSIS — G8929 Other chronic pain: Secondary | ICD-10-CM | POA: Insufficient documentation

## 2021-11-27 DIAGNOSIS — E7800 Pure hypercholesterolemia, unspecified: Secondary | ICD-10-CM | POA: Insufficient documentation

## 2021-11-30 ENCOUNTER — Encounter: Payer: Self-pay | Admitting: Family Medicine

## 2021-12-13 ENCOUNTER — Ambulatory Visit (INDEPENDENT_AMBULATORY_CARE_PROVIDER_SITE_OTHER): Payer: 59 | Admitting: Family Medicine

## 2021-12-13 VITALS — BP 147/86 | HR 87 | Temp 97.5°F | Wt 316.0 lb

## 2021-12-13 DIAGNOSIS — R748 Abnormal levels of other serum enzymes: Secondary | ICD-10-CM | POA: Diagnosis not present

## 2021-12-13 DIAGNOSIS — R109 Unspecified abdominal pain: Secondary | ICD-10-CM | POA: Diagnosis not present

## 2021-12-13 DIAGNOSIS — G8929 Other chronic pain: Secondary | ICD-10-CM | POA: Diagnosis not present

## 2021-12-13 DIAGNOSIS — Z6837 Body mass index (BMI) 37.0-37.9, adult: Secondary | ICD-10-CM

## 2021-12-13 NOTE — Progress Notes (Signed)
Established patient visit   Patient: Francisco Oneal   DOB: 12/22/1970   51 y.o. Male  MRN: [id number] Visit Date: 12/13/2021  Today's healthcare provider: Wilhemena Durie, MD   No chief complaint on file.  Subjective    HPI  Patient is a 51 year old who presents to discuss abdominal ultrasound.  He states he was told he had liver disease.  He is concerned because he has pain that he does not feel is from his liver.  The story is consistent with fatty liver. Is 51.86.  Medications: Outpatient Medications Prior to Visit  Medication Sig   albuterol (VENTOLIN HFA) 108 (90 Base) MCG/ACT inhaler INHALE 1 PUFF INTO THE LUNGS EVERY 6 HOURS AS NEEDED FOR WHEEZING OR SHORTNESS OF BREATH.   ALPRAZolam (XANAX) 0.5 MG tablet TAKE 1/2 TO 1 TABLET BY MOUTH TWICE DAILY AS NEEDED FOR ANXIETY   EPINEPHRINE 0.3 mg/0.3 mL IJ SOAJ injection INJECT 0.3 MG INTO THE MUSCLE AS NEEDED FOR ANAPHYLAXIS.   famotidine (PEPCID) 20 MG tablet TAKE 1 TABLET BY MOUTH EVERYDAY AT BEDTIME   fexofenadine (ALLEGRA ALLERGY) 180 MG tablet Take 1 tablet (180 mg total) by mouth daily.   fluticasone furoate-vilanterol (BREO ELLIPTA) 200-25 MCG/INH AEPB Inhale 1 puff into the lungs daily.   gabapentin (NEURONTIN) 300 MG capsule Take 1 capsule (300 mg total) by mouth at bedtime.   Insulin Pen Needle (BD PEN NEEDLE NANO 2ND GEN) 32G X 4 MM MISC To use with saxenda injections   Liraglutide -Weight Management (SAXENDA) 18 MG/3ML SOPN Inject 3 mg into the skin once a week.   methocarbamol (ROBAXIN) 500 MG tablet TAKE 1 TABLET BY MOUTH EVERY 8 HOURS AS NEEDED FOR MUSCLE SPASMS   methylPREDNISolone (MEDROL DOSEPAK) 4 MG TBPK tablet Use as directed   pantoprazole (PROTONIX) 40 MG tablet Take 1 tablet (40 mg total) by mouth daily.   VITAMIN D PO Take by mouth.   Vitamin D, Ergocalciferol, (DRISDOL) 1.25 MG (50000 UNIT) CAPS capsule TAKE 1 CAPSULE (50,000 UNITS TOTAL) BY MOUTH EVERY 7 (SEVEN) DAYS   No facility-administered  medications prior to visit.    Review of Systems  Gastrointestinal:  Positive for abdominal pain. Negative for abdominal distention, anal bleeding, blood in stool, constipation, diarrhea, nausea, rectal pain and vomiting.   Last lipids Lab Results  Component Value Date   CHOL 249 (H) 05/11/2021   HDL 49 05/11/2021   LDLCALC 153 (H) 05/11/2021   TRIG 254 (H) 05/11/2021   CHOLHDL 5.1 (H) 05/11/2021       Objective    BP (!) 147/86 (BP Location: Left Arm, Patient Position: Sitting, Cuff Size: Large)   Pulse 87   Temp (!) 97.5 F (36.4 C) (Oral)   Wt (!) 316 lb (143.3 kg)   SpO2 97%   BMI 42.86 kg/m  BP Readings from Last 3 Encounters:  12/13/21 (!) 147/86  11/14/21 (!) 145/94  05/11/21 (!) 146/95   Wt Readings from Last 3 Encounters:  12/13/21 (!) 316 lb (143.3 kg)  11/14/21 (!) 323 lb (146.5 kg)  05/11/21 (!) 320 lb (145.2 kg)      Physical Exam Vitals reviewed.  Constitutional:      General: He is not in acute distress.    Appearance: He is well-developed.  HENT:     Head: Normocephalic and atraumatic.     Right Ear: Hearing normal.     Left Ear: Hearing normal.     Nose: Nose normal.  Eyes:     General: Lids are normal. No scleral icterus.       Right eye: No discharge.        Left eye: No discharge.     Conjunctiva/sclera: Conjunctivae normal.  Cardiovascular:     Rate and Rhythm: Normal rate and regular rhythm.     Heart sounds: Normal heart sounds.  Pulmonary:     Effort: Pulmonary effort is normal. No respiratory distress.  Skin:    Findings: No lesion or rash.  Neurological:     General: No focal deficit present.     Mental Status: He is alert and oriented to person, place, and time.  Psychiatric:        Mood and Affect: Mood normal.        Speech: Speech normal.        Behavior: Behavior normal.        Thought Content: Thought content normal.        Judgment: Judgment normal.       No results found for any visits on 12/13/21.  Assessment  & Plan     1. Right flank pain, chronic If anything this is appears to be GU in origin. Musculoskeletal is more likely but patient is anxious about it. - Ambulatory referral to Urology  2. Class 2 severe obesity due to excess calories with serious comorbidity and body mass index (BMI) of 37.0 to 37.9 in adult [facility]) Diet and exercise is stressed.  3. Elevated liver enzymes 90 liver is likely etiology of enzymes been elevated.  May need GI referral.   Return in about 4 months (around 04/14/2022).      I, Wilhemena Durie, MD, have reviewed all documentation for this visit. The documentation on 12/22/21 for the exam, diagnosis, procedures, and orders are all accurate and complete.    Raylene Carmickle Cranford Mon, MD  [facility] [phone] (phone) [phone] (fax)  [place]

## 2021-12-13 NOTE — Patient Instructions (Signed)
TRY OVER-THE-COUNTER GASEX OR BEANO. CHECK YOUR HOME BLOOD PRESSURES WEEKLY.

## 2021-12-22 ENCOUNTER — Ambulatory Visit: Payer: 59 | Admitting: Urology

## 2021-12-26 ENCOUNTER — Encounter: Payer: Self-pay | Admitting: Urology

## 2021-12-26 ENCOUNTER — Other Ambulatory Visit: Payer: Self-pay | Admitting: *Deleted

## 2021-12-26 ENCOUNTER — Other Ambulatory Visit
Admission: RE | Admit: 2021-12-26 | Discharge: 2021-12-26 | Disposition: A | Discharge status: Home / Self Care | Payer: 59 | Attending: Urology | Admitting: Urology

## 2021-12-26 ENCOUNTER — Ambulatory Visit (INDEPENDENT_AMBULATORY_CARE_PROVIDER_SITE_OTHER): Payer: 59 | Admitting: Urology

## 2021-12-26 VITALS — BP 136/85 | HR 77 | Ht 72.0 in | Wt 308.0 lb

## 2021-12-26 DIAGNOSIS — R399 Unspecified symptoms and signs involving the genitourinary system: Secondary | ICD-10-CM | POA: Diagnosis not present

## 2021-12-26 DIAGNOSIS — R109 Unspecified abdominal pain: Secondary | ICD-10-CM | POA: Diagnosis not present

## 2021-12-26 DIAGNOSIS — R300 Dysuria: Secondary | ICD-10-CM | POA: Insufficient documentation

## 2021-12-26 LAB — URINALYSIS, COMPLETE (UACMP) WITH MICROSCOPIC
Glucose, UA: NEGATIVE mg/dL
Hgb urine dipstick: NEGATIVE
Ketones, ur: 15 mg/dL — AB
Leukocytes,Ua: NEGATIVE
Nitrite: NEGATIVE
Protein, ur: NEGATIVE mg/dL
Specific Gravity, Urine: 1.025 (ref 1.005–1.030)
Squamous Epithelial / HPF: NONE SEEN (ref 0–5)
WBC, UA: NONE SEEN WBC/hpf (ref 0–5)
pH: 5.5 (ref 5.0–8.0)

## 2021-12-26 LAB — BLADDER SCAN AMB NON-IMAGING

## 2021-12-26 NOTE — Progress Notes (Signed)
   12/26/21 1:23 PM   Francisco Oneal 02/23/1971 [id number]  CC: Right-sided flank and abdominal pain, urinary symptoms, pain with ejaculations  HPI: I saw Francisco Oneal today for the above issues.  He is a 51 year old male with morbid obesity and BMI of 42 as well as diabetes who reports at least a few months of intermittent right-sided abdominal pain of unclear etiology.  He also reports some increased urgency with urination and decreased sensation of the bladder.  He also has a " pinching" sensation in his left abdomen with ejaculations.  He has had extensive work-up including a benign urinalysis, normal abdominal ultrasound with no hydronephrosis, normal PSA of 0.5, and normal KUB with no evidence of stones.  He denies any gross hematuria.  PVR is normal today at 48m.  Surgical History: Past Surgical History:  Procedure Laterality Date   APPENDECTOMY     INCISION AND DRAINAGE OF WOUND Left 12/08/2014   Procedure: IRRIGATION AND DEBRIDEMENT WOUND;  Surgeon: JCorky Mull MD;  Location: [facility] ORS;  Service: Orthopedics;  Laterality: Left;   OPEN REDUCTION INTERNAL FIXATION (ORIF) DISTAL RADIAL FRACTURE Left 12/08/2014   Procedure: OPEN REDUCTION INTERNAL FIXATION (ORIF) DISTAL RADIAL FRACTURE;  Surgeon: JCorky Mull MD;  Location: [facility] ORS;  Service: Orthopedics;  Laterality: Left;     Family History: Family History  Problem Relation Age of Onset   Emphysema Mother    Cancer Mother     Social History:  reports that he has never smoked. He has never been exposed to tobacco smoke. He has never used smokeless tobacco. He reports current alcohol use of about 8.0 standard drinks of alcohol per week. He reports that he does not use drugs.  Physical Exam: BP 136/85   Pulse 77   Ht 6' (1.829 m)   Wt (!) 308 lb (139.7 kg)   BMI 41.77 kg/m    Constitutional:  Alert and oriented, No acute distress. Cardiovascular: No clubbing, cyanosis, or edema. Respiratory: Normal respiratory effort, no  increased work of breathing. GI: Abdomen is soft, nontender, nondistended, no abdominal masses, no skin lesions   Laboratory Data: Reviewed  Pertinent Imaging: I have personally viewed and interpreted the abdominal ultrasound and KUB showing no urologic abnormalities  Assessment & Plan:   51year old male with right lower abdominal pain of unclear etiology.  Reassurance was provided regarding his negative work-up thus far including normal renal ultrasound with no hydronephrosis, no evidence of stones on KUB, normal urinalysis x2, normal PSA, normal PVR.  I do not have a good answer for the occasional pinching sensation he feels with ejaculations.  We discussed behavioral strategies including weight loss, exercise, diabetes control regarding his urinary symptoms, and avoiding bladder irritants.  We also discussed considering a CT scan for further evaluation of his abdominal and back pain, but that from a urology perspective everything has been normal thus far.  He would like to defer CT at this time which is reasonable.  Follow-up with urology as needed   BNickolas Madrid MD 12/26/2021  [address] [place] [phone]

## 2021-12-26 NOTE — Patient Instructions (Signed)
Your lab work and imaging are all reassuring and normal.  Your PSA test that looks for prostate cancer was normal in November, and your urinalysis is normal today.  You are emptying your bladder completely.  Ultrasound of your kidneys also looks normal, and x-ray shows no evidence of kidney stones.

## 2022-01-23 ENCOUNTER — Other Ambulatory Visit: Payer: Self-pay | Admitting: Family Medicine

## 2022-01-24 NOTE — Telephone Encounter (Signed)
Requested Prescriptions  Pending Prescriptions Disp Refills  . SAXENDA 18 MG/3ML SOPN [Pharmacy Med Name: SAXENDA  6MG  INJ  ML] 45 mL 3    Sig: INJECT 3MG SUBCUTANEOUSLY  DAILY     Endocrinology:  Diabetes - GLP-1 Receptor Agonists Failed - 01/23/2022 10:21 PM      Failed - HBA1C is between 0 and 7.9 and within 180 days    Hgb A1c MFr Bld  Date Value Ref Range Status  09/25/2019 5.1 4.8 - 5.6 % Final    Comment:             Prediabetes: 5.7 - 6.4          Diabetes: >6.4          Glycemic control for adults with diabetes: <7.0          Passed - Valid encounter within last 6 months    Recent Outpatient Visits          1 month ago Right flank pain, chronic   [facility] Jerrol Banana., MD   2 months ago Right flank pain, chronic   [facility] Jerrol Banana., MD   8 months ago Annual physical exam   [facility] Jerrol Banana., MD   9 months ago Other chronic pain   [facility] Jerrol Banana., MD   11 months ago Allergic angioedema, initial encounter   [facility] Charlynne Cousins, MD

## 2022-02-06 ENCOUNTER — Other Ambulatory Visit: Payer: Self-pay | Admitting: Family Medicine

## 2022-02-06 DIAGNOSIS — K219 Gastro-esophageal reflux disease without esophagitis: Secondary | ICD-10-CM

## 2022-02-12 ENCOUNTER — Telehealth: Payer: Self-pay

## 2022-02-12 NOTE — Telephone Encounter (Signed)
Copied from [place] [id number]. Topic: General - Other >> Feb 12, 2022  3:25 PM Cyndi Bender wrote: Reason for CRM: Maritza with [facility] [place] reports that [place] denial for SAXENDA 18 MG/3ML SOPN. Maritza asked if patient could be notified of the denial. Cb# [phone] Reference# [id number]

## 2022-02-14 NOTE — Telephone Encounter (Signed)
Pt called, was advised of the denial. He said that was ok d/t he's not taking the medication right now anyway. No further assistance needed.

## 2022-04-02 ENCOUNTER — Other Ambulatory Visit: Payer: Self-pay | Admitting: Family Medicine

## 2022-04-02 DIAGNOSIS — M62838 Other muscle spasm: Secondary | ICD-10-CM

## 2022-04-06 ENCOUNTER — Telehealth: Payer: Self-pay | Admitting: *Deleted

## 2022-04-06 NOTE — Telephone Encounter (Signed)
Isurance agent for prior authorizations called to inquire what the pt's BMI is, if he has been on this medication, Saxenda, before, and if he has any co-morbidities such as hypercholesterol. Answers given.

## 2022-04-09 ENCOUNTER — Telehealth: Payer: Self-pay | Admitting: *Deleted

## 2022-04-09 NOTE — Telephone Encounter (Signed)
'  Anderson Malta' with [employer] calling. Needs information on med Saxenda. Questioning if pt "Had 5% weight loss or maintained baseline weight first 6 months on medication.   States please Fax information: [phone]

## 2022-04-11 ENCOUNTER — Encounter: Payer: Self-pay | Admitting: Physician Assistant

## 2022-04-12 NOTE — Telephone Encounter (Signed)
Returned call to [facility].

## 2022-04-13 NOTE — Telephone Encounter (Signed)
Amir calling for [employer] is calling to report that the claim has been approved on the saxeda.

## 2022-04-14 ENCOUNTER — Other Ambulatory Visit: Payer: Self-pay | Admitting: Family Medicine

## 2022-04-14 DIAGNOSIS — G2581 Restless legs syndrome: Secondary | ICD-10-CM

## 2022-04-16 NOTE — Telephone Encounter (Signed)
Medication was discontinued 12/26/2021. Med not on med list.  Requested Prescriptions  Pending Prescriptions Disp Refills  . gabapentin (NEURONTIN) 300 MG capsule [Pharmacy Med Name: Gabapentin 300 MG Oral Capsule] 90 capsule 3    Sig: TAKE 1 CAPSULE BY MOUTH AT  BEDTIME     Neurology: Anticonvulsants - gabapentin Passed - 04/14/2022 10:20 PM      Passed - Cr in normal range and within 360 days    Creatinine  Date Value Ref Range Status  06/07/2013 0.97 0.60 - 1.30 mg/dL Final   Creatinine, Ser  Date Value Ref Range Status  05/11/2021 1.12 0.76 - 1.27 mg/dL Final         Passed - Completed PHQ-2 or PHQ-9 in the last 360 days      Passed - Valid encounter within last 12 months    Recent Outpatient Visits          4 months ago Right flank pain, chronic   [facility] Jerrol Banana., MD   5 months ago Right flank pain, chronic   [facility] Jerrol Banana., MD   11 months ago Annual physical exam   [facility] Jerrol Banana., MD   12 months ago Other chronic pain   [facility] Jerrol Banana., MD   1 year ago Allergic angioedema, initial encounter   [place], Avanti, MD      Future Appointments            In 1 month Gwyneth Sprout, [place], [place]

## 2022-04-16 NOTE — Telephone Encounter (Signed)
Noted  

## 2022-04-25 ENCOUNTER — Encounter: Payer: Self-pay | Admitting: Family Medicine

## 2022-04-26 ENCOUNTER — Other Ambulatory Visit: Payer: Self-pay | Admitting: Family Medicine

## 2022-04-26 ENCOUNTER — Telehealth: Payer: Self-pay

## 2022-04-26 MED ORDER — FLUTICASONE FUROATE-VILANTEROL 200-25 MCG/ACT IN AEPB: 1.0000 | INHALATION_SPRAY | Freq: Every day | RESPIRATORY_TRACT | 11 refills | Status: DC

## 2022-04-26 NOTE — Telephone Encounter (Signed)
Copied from [place] [phone]. Topic: General - Other >> Apr 25, 2022  5:01 PM Cyndi Bender wrote: Reason for CRM: Raquel Sarna with [employer] reports that the appeal for [facility] was overturned and approved upto 10/13/22. Cb# [phone]

## 2022-04-26 NOTE — Telephone Encounter (Signed)
Ok noted ty

## 2022-05-15 ENCOUNTER — Ambulatory Visit: Payer: 59 | Admitting: Family Medicine

## 2022-05-15 NOTE — Progress Notes (Unsigned)
Complete physical exam  Patient: Francisco Oneal   DOB: 1970/10/26   51 y.o. Male  MRN: [id number] Visit Date: 05/17/2022  Today's healthcare provider: Gwyneth Sprout, FNP  Patient presents for new patient visit to establish care.  Introduced to Designer, jewellery role and practice setting.  All questions answered.  Discussed provider/patient relationship and expectations.  I,Arnitra Sokoloski J Lakaya Tolen,acting as a scribe for Gwyneth Sprout, FNP.,have documented all relevant documentation on the behalf of Gwyneth Sprout, FNP,as directed by  Gwyneth Sprout, FNP while in the presence of Gwyneth Sprout, FNP.  Chief Complaint  Patient presents with   Establish Care   Annual Exam   Subjective    Francisco Oneal is a 52 y.o. male who presents today for a complete physical exam.  He reports consuming a general diet. Home exercise routine includes walking and swimming. He generally feels well. He reports sleeping well. He does not have additional problems to discuss today  HPI   History reviewed. No pertinent past medical history. Past Surgical History:  Procedure Laterality Date   APPENDECTOMY     INCISION AND DRAINAGE OF WOUND Left 12/08/2014   Procedure: IRRIGATION AND DEBRIDEMENT WOUND;  Surgeon: Corky Mull, MD;  Location: [facility] ORS;  Service: Orthopedics;  Laterality: Left;   OPEN REDUCTION INTERNAL FIXATION (ORIF) DISTAL RADIAL FRACTURE Left 12/08/2014   Procedure: OPEN REDUCTION INTERNAL FIXATION (ORIF) DISTAL RADIAL FRACTURE;  Surgeon: Corky Mull, MD;  Location: [facility] OR[facility];  Service: Orthopedics;  Laterality: Left;   Social History   Socioeconomic History   Marital status: Married    Spouse name: Not on file   Number of children: Not on file   Years of education: Not on file   Highest education level: Not on file  Occupational History   Not on file  Tobacco Use   Smoking status: Never    Passive exposure: Never   Smokeless tobacco: Never  Substance and Sexual Activity   Alcohol use: Yes     Alcohol/week: 8.0 standard drinks of alcohol    Types: 8 Glasses of wine per week   Drug use: Never   Sexual activity: Yes  Other Topics Concern   Not on file  Social History Narrative   Not on file   Social Determinants of Health   Financial Resource Strain: Not on file  Food Insecurity: Not on file  Transportation Needs: Not on file  Physical Activity: Not on file  Stress: Not on file  Social Connections: Not on file  Intimate Partner Violence: Not on file   Family Status  Relation Name Status   Mother  Alive   Father  Alive   Son  Alive   Family History  Problem Relation Age of Onset   Emphysema Mother    Cancer Mother    Allergies  Allergen Reactions   Bee Venom Anaphylaxis   Penicillins Other (See Comments)    Has patient had a PCN reaction causing immediate rash, facial/tongue/throat swelling, SOB or lightheadedness with hypotension: Yes Has patient had a PCN reaction causing severe rash involving mucus membranes or skin necrosis: Yes Has patient had a PCN reaction that required hospitalization: Yes Has patient had a PCN reaction occurring within the last 10 years: No If all of the above answers are "NO", then may proceed with Cephalosporin use.    Medications: Outpatient Medications Prior to Visit  Medication Sig   fluticasone furoate-vilanterol (BREO ELLIPTA) 200-25 MCG/ACT AEPB Inhale 1 puff into the  lungs daily.   methocarbamol (ROBAXIN) 500 MG tablet TAKE 1 TABLET BY MOUTH EVERY 8 HOURS AS NEEDED FOR MUSCLE SPASM   pantoprazole (PROTONIX) 40 MG tablet TAKE 1 TABLET BY MOUTH DAILY. GENERIC EQUIVALENT FOR PROTONIX   SAXENDA 18 MG/3ML SOPN INJECT 3MG SUBCUTANEOUSLY  DAILY   [DISCONTINUED] albuterol (VENTOLIN HFA) 108 (90 Base) MCG/ACT inhaler INHALE 1 PUFF INTO THE LUNGS EVERY 6 HOURS AS NEEDED FOR WHEEZING OR SHORTNESS OF BREATH.   [DISCONTINUED] ALPRAZolam (XANAX) 0.5 MG tablet TAKE 1/2 TO 1 TABLET BY MOUTH TWICE DAILY AS NEEDED FOR ANXIETY   [DISCONTINUED]  EPINEPHRINE 0.3 mg/0.3 mL IJ SOAJ injection INJECT 0.3 MG INTO THE MUSCLE AS NEEDED FOR ANAPHYLAXIS.   [DISCONTINUED] famotidine (PEPCID) 20 MG tablet TAKE 1 TABLET BY MOUTH EVERYDAY AT BEDTIME   [DISCONTINUED] fexofenadine (ALLEGRA ALLERGY) 180 MG tablet Take 1 tablet (180 mg total) by mouth daily.   [DISCONTINUED] Insulin Pen Needle (BD PEN NEEDLE NANO 2ND GEN) 32G X 4 MM MISC To use with saxenda injections   No facility-administered medications prior to visit.    Review of Systems   Objective    BP 125/78 Comment: home reading  Pulse 75   Resp 16   Ht 6' (1.829 m)   Wt 293 lb (132.9 kg)   SpO2 97%   BMI 39.74 kg/m   Physical Exam Vitals and nursing note reviewed.  Constitutional:      General: He is awake. He is not in acute distress.    Appearance: Normal appearance. He is well-developed and well-groomed. He is obese. He is not ill-appearing, toxic-appearing or diaphoretic.  HENT:     Head: Normocephalic and atraumatic.     Jaw: There is normal jaw occlusion. No trismus, tenderness, swelling or pain on movement.     Salivary Glands: Right salivary gland is not diffusely enlarged or tender. Left salivary gland is not diffusely enlarged or tender.     Right Ear: Hearing, tympanic membrane, ear canal and external ear normal. There is no impacted cerumen.     Left Ear: Hearing, tympanic membrane, ear canal and external ear normal. There is no impacted cerumen.     Nose: Nose normal. No congestion or rhinorrhea.     Right Turbinates: Not enlarged, swollen or pale.     Left Turbinates: Not enlarged, swollen or pale.     Right Sinus: No maxillary sinus tenderness or frontal sinus tenderness.     Left Sinus: No maxillary sinus tenderness or frontal sinus tenderness.     Mouth/Throat:     Lips: Pink.     Mouth: Mucous membranes are moist. No injury, lacerations, oral lesions or angioedema.     Pharynx: Oropharynx is clear. Uvula midline. No pharyngeal swelling, oropharyngeal  exudate or posterior oropharyngeal erythema.     Tonsils: No tonsillar exudate or tonsillar abscesses.  Eyes:     General: Lids are normal. Vision grossly intact. Gaze aligned appropriately.        Right eye: No discharge.        Left eye: No discharge.     Extraocular Movements: Extraocular movements intact.     Conjunctiva/sclera: Conjunctivae normal.     Pupils: Pupils are equal, round, and reactive to light.  Neck:     Thyroid: No thyroid mass, thyromegaly or thyroid tenderness.     Vascular: No carotid bruit.     Trachea: Trachea normal. No tracheal tenderness.  Cardiovascular:     Rate and Rhythm: Normal rate and regular  rhythm.     Pulses: Normal pulses.          Carotid pulses are 2+ on the right side and 2+ on the left side.      Radial pulses are 2+ on the right side and 2+ on the left side.       Femoral pulses are 2+ on the right side and 2+ on the left side.      Popliteal pulses are 2+ on the right side and 2+ on the left side.       Dorsalis pedis pulses are 2+ on the right side and 2+ on the left side.       Posterior tibial pulses are 2+ on the right side and 2+ on the left side.     Heart sounds: Normal heart sounds, S1 normal and S2 normal. No murmur heard.    No friction rub. No gallop.  Pulmonary:     Effort: Pulmonary effort is normal. No respiratory distress.     Breath sounds: Normal breath sounds and air entry. No stridor. No wheezing, rhonchi or rales.  Chest:     Chest wall: No tenderness.  Abdominal:     General: Abdomen is flat. Bowel sounds are normal. There is no distension.     Palpations: Abdomen is soft. There is no mass.     Tenderness: There is no abdominal tenderness. There is no guarding or rebound.     Hernia: No hernia is present.  Genitourinary:    Comments: Exam deferred; denies complaints Musculoskeletal:        General: No swelling, tenderness, deformity or signs of injury. Normal range of motion.     Cervical back: Normal range of  motion and neck supple. No rigidity or tenderness.     Right lower leg: No edema.     Left lower leg: No edema.  Lymphadenopathy:     Cervical: No cervical adenopathy.     Right cervical: No superficial, deep or posterior cervical adenopathy.    Left cervical: No superficial, deep or posterior cervical adenopathy.  Skin:    General: Skin is warm and dry.     Capillary Refill: Capillary refill takes less than 2 seconds.     Coloration: Skin is not jaundiced or pale.     Findings: No bruising, erythema, lesion or rash.  Neurological:     General: No focal deficit present.     Mental Status: He is alert and oriented to person, place, and time. Mental status is at baseline.     GCS: GCS eye subscore is 4. GCS verbal subscore is 5. GCS motor subscore is 6.     Sensory: Sensation is intact. No sensory deficit.     Motor: Motor function is intact. No weakness.     Coordination: Coordination is intact.     Gait: Gait is intact.  Psychiatric:        Attention and Perception: Attention and perception normal.        Mood and Affect: Mood and affect normal.        Speech: Speech normal.        Behavior: Behavior normal. Behavior is cooperative.        Thought Content: Thought content normal.        Cognition and Memory: Cognition normal.        Judgment: Judgment normal.      Last depression screening scores    05/17/2022    8:46 AM 12/13/2021  8:56 AM 05/11/2021    2:19 PM  PHQ 2/9 Scores  PHQ - 2 Score 0 0 0  PHQ- 9 Score 0 1 0   Last fall risk screening    05/17/2022    8:46 AM  [place] in the past year? 0  Number falls in past yr: 0  Injury with Fall? 0  Risk for fall due to : No Fall Risks  Follow up Falls evaluation completed   Last Audit-C alcohol use screening    05/17/2022    8:46 AM  Alcohol Use Disorder Test (AUDIT)  1. How often do you have a drink containing alcohol? 3  2. How many drinks containing alcohol do you have on a typical day when you are  drinking? 0  3. How often do you have six or more drinks on one occasion? 1  AUDIT-C Score 4   A score of 3 or more in women, and 4 or more in men indicates increased risk for alcohol abuse, EXCEPT if all of the points are from question 1   No results found for any visits on 05/17/22.  Assessment & Plan    Routine Health Maintenance and Physical Exam  Exercise Activities and Dietary recommendations  Goals   None     Immunization History  Administered Date(s) Administered   Influenza,inj,Quad PF,6+ Mos 04/09/2016, 06/13/2018, 04/29/2020, 04/19/2021, 05/17/2022   Influenza-Unspecified 02/25/2019, 04/29/2020   Moderna Sars-Covid-2 Vaccination 10/08/2019, 11/05/2019, 06/24/2020   Td 11/27/2012   Tdap 11/27/2012    Health Maintenance  Topic Date Due   COVID-19 Vaccine (4 - Moderna series) 06/02/2022 (Originally 08/19/2020)   Zoster Vaccines- Shingrix (1 of 2) 08/17/2022 (Originally 11/28/2019)   TETANUS/TDAP  11/28/2022   Fecal DNA (Cologuard)  05/07/2023   INFLUENZA VACCINE  Completed   Hepatitis C Screening  Completed   HIV Screening  Completed   HPV VACCINES  Aged Out    Discussed health benefits of physical activity, and encouraged him to engage in regular exercise appropriate for his age and condition.  Problem List Items Addressed This Visit       Cardiovascular and Mediastinum   White coat syndrome without diagnosis of hypertension    Reports normotensive at home; is not on any medications to control HTN Goal <140/<90        Other   Annual physical exam - Primary    UTD on dental and vision Things to do to keep yourself healthy  - Exercise at least 30-45 minutes a day, 3-4 days a week.  - Eat a low-fat diet with lots of fruits and vegetables, up to 7-9 servings per day.  - Seatbelts can save your life. Wear them always.  - Smoke detectors on every level of your home, check batteries every year.  - Eye Doctor - have an eye exam every 1-2 years  - Safe sex -  if you may be exposed to STDs, use a condom.  - Alcohol -  If you drink, do it moderately, less than 2 drinks per day.  - [place]. Choose someone to speak for you if you are not able.  - Depression is common in our stressful world.If you're feeling down or losing interest in things you normally enjoy, please come in for a visit.  - Violence - If anyone is threatening or hurting you, please call immediately.       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 Discussed importance of healthy weight management Discussed diet and exercise       Elevated LDL cholesterol level    Historically elevated; recommend diet low in saturated fat and regular exercise - 30 min at least 5 times per week Repeat LP Goal LDL <100       Relevant Orders   Lipid panel   Need for influenza vaccination    Consented; VIS made available; no immediate side effects following administration; plan to repeat annually       Relevant Orders   Flu Vaccine QUAD 6+ mos PF IM (Fluarix Quad PF) (Completed)   Prostate cancer screening    Denies LUTS; recommend PSA in place of DRE. If PSA is elevated for age, we will repeat; if PSA remains elevated pt will be referred to urology for DRE and next steps for best treatment.       Relevant Orders   PSA   Return in about 1 year (around 05/18/2023) for annual examination.    Vonna Kotyk, FNP, have reviewed all documentation for this visit. The documentation on 05/17/22 for the exam, diagnosis, procedures, and orders are all accurate and complete.  Gwyneth Sprout, [place] [phone] (phone) [phone] (fax)  [place]

## 2022-05-17 ENCOUNTER — Encounter: Payer: Self-pay | Admitting: Family Medicine

## 2022-05-17 ENCOUNTER — Ambulatory Visit (INDEPENDENT_AMBULATORY_CARE_PROVIDER_SITE_OTHER): Payer: 59 | Admitting: Family Medicine

## 2022-05-17 VITALS — BP 125/78 | HR 75 | Resp 16 | Ht 72.0 in | Wt 293.0 lb

## 2022-05-17 DIAGNOSIS — E7800 Pure hypercholesterolemia, unspecified: Secondary | ICD-10-CM | POA: Diagnosis not present

## 2022-05-17 DIAGNOSIS — Z0000 Encounter for general adult medical examination without abnormal findings: Secondary | ICD-10-CM | POA: Diagnosis not present

## 2022-05-17 DIAGNOSIS — Z23 Encounter for immunization: Secondary | ICD-10-CM

## 2022-05-17 DIAGNOSIS — Z125 Encounter for screening for malignant neoplasm of prostate: Secondary | ICD-10-CM | POA: Insufficient documentation

## 2022-05-17 DIAGNOSIS — Z6839 Body mass index (BMI) 39.0-39.9, adult: Secondary | ICD-10-CM

## 2022-05-17 DIAGNOSIS — E661 Drug-induced obesity: Secondary | ICD-10-CM

## 2022-05-17 DIAGNOSIS — R030 Elevated blood-pressure reading, without diagnosis of hypertension: Secondary | ICD-10-CM

## 2022-05-17 NOTE — Assessment & Plan Note (Signed)
Historically elevated; recommend diet low in saturated fat and regular exercise - 30 min at least 5 times per week Repeat LP Goal LDL <100

## 2022-05-17 NOTE — Assessment & Plan Note (Signed)
Consented; VIS made available; no immediate side effects following administration; plan to repeat annually

## 2022-05-17 NOTE — Assessment & Plan Note (Signed)
Reports normotensive at home; is not on any medications to control HTN Goal <140/<90

## 2022-05-17 NOTE — Assessment & Plan Note (Signed)
UTD on dental and vision Things to do to keep yourself healthy  - Exercise at least 30-45 minutes a day, 3-4 days a week.  - Eat a low-fat diet with lots of fruits and vegetables, up to 7-9 servings per day.  - Seatbelts can save your life. Wear them always.  - Smoke detectors on every level of your home, check batteries every year.  - Eye Doctor - have an eye exam every 1-2 years  - Safe sex - if you may be exposed to STDs, use a condom.  - Alcohol -  If you drink, do it moderately, less than 2 drinks per day.  - [place]. Choose someone to speak for you if you are not able.  - Depression is common in our stressful world.If you're feeling down or losing interest in things you normally enjoy, please come in for a visit.  - Violence - If anyone is threatening or hurting you, please call immediately.

## 2022-05-17 NOTE — Assessment & Plan Note (Signed)
Chronic, stable Body mass index is 39.74 kg/m. Discussed importance of healthy weight management Discussed diet and exercise

## 2022-05-17 NOTE — Assessment & Plan Note (Addendum)
Denies LUTS; recommend PSA in place of DRE. If PSA is elevated for age, we will repeat; if PSA remains elevated pt will be referred to urology for DRE and next steps for best treatment.

## 2022-05-18 LAB — CBC WITH DIFFERENTIAL/PLATELET
Basophils Absolute: 0 10*3/uL (ref 0.0–0.2)
Basos: 0 %
EOS (ABSOLUTE): 0.1 10*3/uL (ref 0.0–0.4)
Eos: 2 %
Hematocrit: 48 % (ref 37.5–51.0)
Hemoglobin: 16.1 g/dL (ref 13.0–17.7)
Immature Grans (Abs): 0 10*3/uL (ref 0.0–0.1)
Immature Granulocytes: 0 %
Lymphocytes Absolute: 2.9 10*3/uL (ref 0.7–3.1)
Lymphs: 42 %
MCH: 30.9 pg (ref 26.6–33.0)
MCHC: 33.5 g/dL (ref 31.5–35.7)
MCV: 92 fL (ref 79–97)
Monocytes Absolute: 0.5 10*3/uL (ref 0.1–0.9)
Monocytes: 8 %
Neutrophils Absolute: 3.3 10*3/uL (ref 1.4–7.0)
Neutrophils: 48 %
Platelets: 204 10*3/uL (ref 150–450)
RBC: 5.21 x10E6/uL (ref 4.14–5.80)
RDW: 12.5 % (ref 11.6–15.4)
WBC: 6.9 10*3/uL (ref 3.4–10.8)

## 2022-05-18 LAB — COMPREHENSIVE METABOLIC PANEL
ALT: 19 IU/L (ref 0–44)
AST: 16 IU/L (ref 0–40)
Albumin/Globulin Ratio: 1.5 (ref 1.2–2.2)
Albumin: 4.4 g/dL (ref 3.8–4.9)
Alkaline Phosphatase: 54 IU/L (ref 44–121)
BUN/Creatinine Ratio: 13 (ref 9–20)
BUN: 14 mg/dL (ref 6–24)
Bilirubin Total: 0.4 mg/dL (ref 0.0–1.2)
CO2: 22 mmol/L (ref 20–29)
Calcium: 9.6 mg/dL (ref 8.7–10.2)
Chloride: 102 mmol/L (ref 96–106)
Creatinine, Ser: 1.06 mg/dL (ref 0.76–1.27)
Globulin, Total: 3 g/dL (ref 1.5–4.5)
Glucose: 98 mg/dL (ref 70–99)
Potassium: 5.2 mmol/L (ref 3.5–5.2)
Sodium: 140 mmol/L (ref 134–144)
Total Protein: 7.4 g/dL (ref 6.0–8.5)
eGFR: 84 mL/min/{1.73_m2} (ref >59)

## 2022-05-18 LAB — LIPID PANEL
Chol/HDL Ratio: 4.4 ratio (ref 0.0–5.0)
Cholesterol, Total: 224 mg/dL — ABNORMAL HIGH (ref 100–199)
HDL: 51 mg/dL (ref >39)
LDL Chol Calc (NIH): 144 mg/dL — ABNORMAL HIGH (ref 0–99)
Triglycerides: 162 mg/dL — ABNORMAL HIGH (ref 0–149)
VLDL Cholesterol Cal: 29 mg/dL (ref 5–40)

## 2022-05-18 LAB — PSA: Prostate Specific Ag, Serum: 0.5 ng/mL (ref 0.0–4.0)

## 2022-05-18 NOTE — Progress Notes (Signed)
Cholesterol remains elevated; total is improved slightly, as are fats. Bad cholesterol remains elevated. Heart attack and stroke risk remain low-moderate at 4.6% in 10 years. I continue to recommend diet low in saturated fat and regular exercise - 30 min at least 5 times per week   The 10-year ASCVD risk score (Arnett DK, et al., 2019) is: 4.6%   Values used to calculate the score:     Age: 51 years     Sex: Male     Is Non-Hispanic African American: No     Diabetic: No     Tobacco smoker: No     Systolic Blood Pressure: 225 mmHg     Is BP treated: No     HDL Cholesterol: 51 mg/dL     Total Cholesterol: 224 mg/dL  All other labs are stable.  Gwyneth Sprout, [address] [place], [place] [address] [phone] (phone) [phone] (fax) [place]

## 2022-06-10 ENCOUNTER — Other Ambulatory Visit: Payer: Self-pay | Admitting: Family Medicine

## 2022-06-10 DIAGNOSIS — K219 Gastro-esophageal reflux disease without esophagitis: Secondary | ICD-10-CM

## 2022-06-11 NOTE — Telephone Encounter (Signed)
Requested Prescriptions  Pending Prescriptions Disp Refills   pantoprazole (PROTONIX) 40 MG tablet [Pharmacy Med Name: Pantoprazole Sodium 40 MG Oral Tablet Delayed Release] 90 tablet 3    Sig: TAKE 1 TABLET BY MOUTH DAILY     Gastroenterology: Proton Pump Inhibitors Passed - 06/10/2022 11:03 PM      Passed - Valid encounter within last 12 months    Recent Outpatient Visits           3 weeks ago Annual physical exam   [facility] Tally Joe T, FNP   6 months ago Right flank pain, chronic   [facility] Jerrol Banana., MD   6 months ago Right flank pain, chronic   [facility] Jerrol Banana., MD   1 year ago Annual physical exam   [facility] Jerrol Banana., MD   1 year ago Other chronic pain   [facility] Jerrol Banana., MD       Future Appointments             In 11 months Gwyneth Sprout, [place], [place]

## 2022-06-12 ENCOUNTER — Telehealth: Payer: Self-pay | Admitting: Family Medicine

## 2022-06-12 NOTE — Telephone Encounter (Signed)
[place] faxed refill request for the following medications:   gabapentin (NEURONTIN) 300 MG capsule    Please advise.

## 2022-06-12 NOTE — Telephone Encounter (Signed)
Medication not on active list.

## 2022-06-14 ENCOUNTER — Other Ambulatory Visit: Payer: Self-pay | Admitting: Family Medicine

## 2022-06-14 MED ORDER — GABAPENTIN 300 MG PO CAPS: 300.0000 mg | ORAL_CAPSULE | Freq: Three times a day (TID) | ORAL | 3 refills | Status: DC

## 2022-07-12 ENCOUNTER — Ambulatory Visit: Payer: 59 | Admitting: Family Medicine

## 2022-07-24 IMAGING — CR DG ABDOMEN 1V
1 series · 4 of 4 positions shown · non-contrast
Comparison: None Available.

CLINICAL DATA: Right flank pain for several months.

EXAM:
ABDOMEN - 1 VIEW

[Series 1: dg abd 1 view · 0.14mm/px · 4 of 4 slices shown]
[im 1/4]
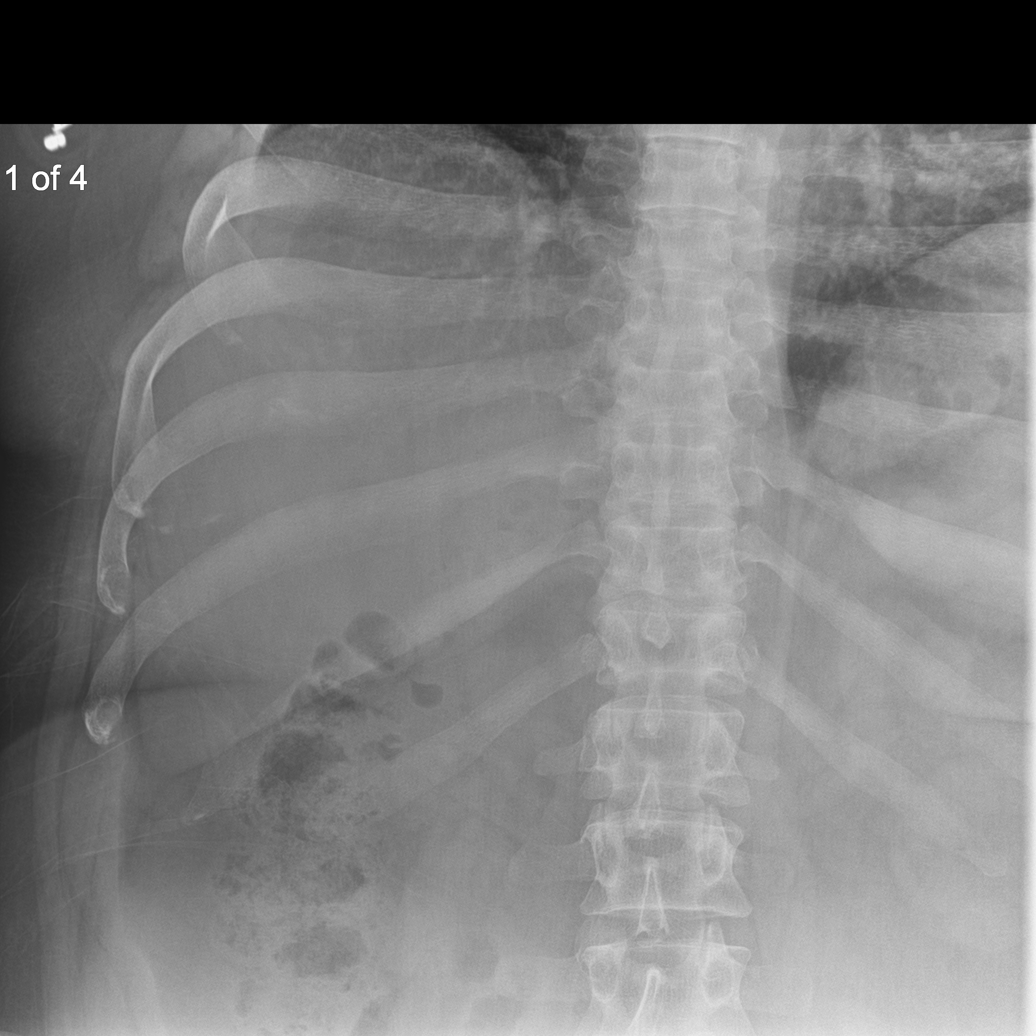
[im 2/4]
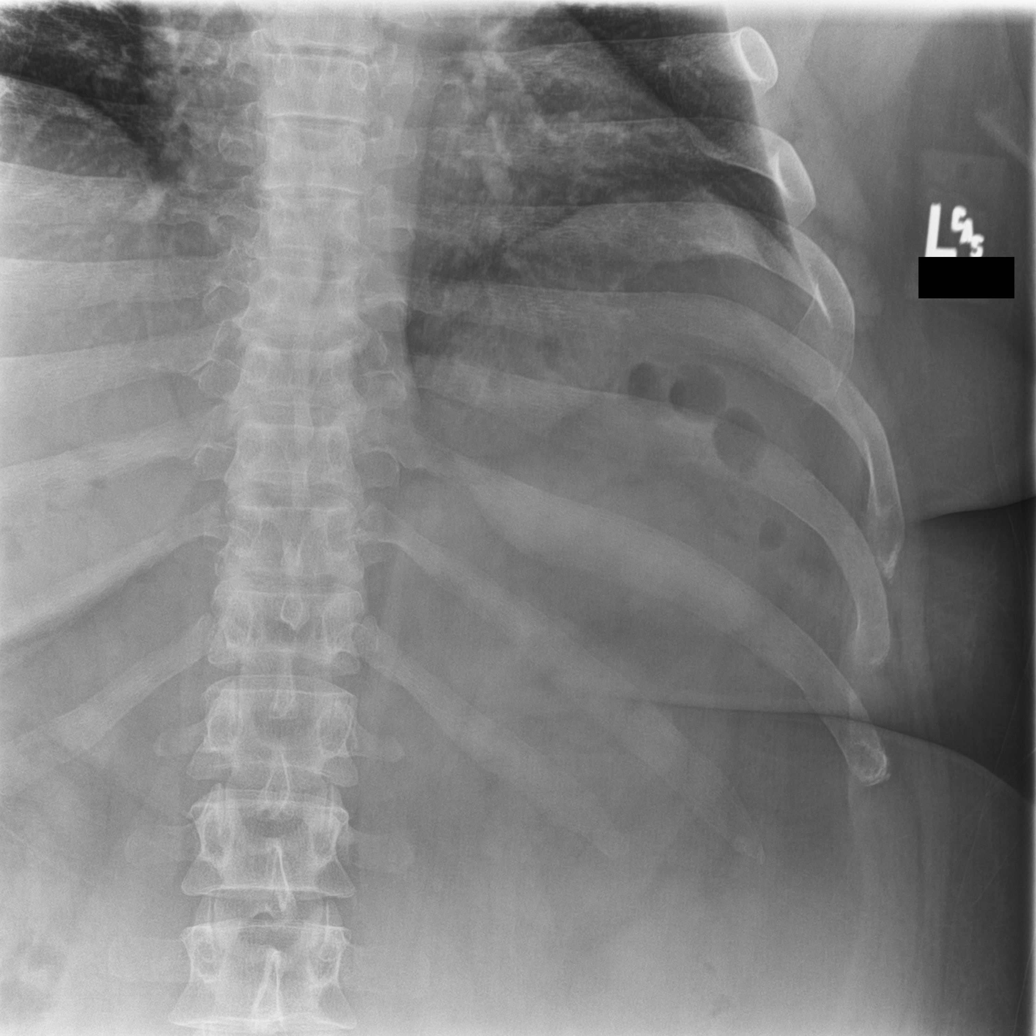
[im 3/4]
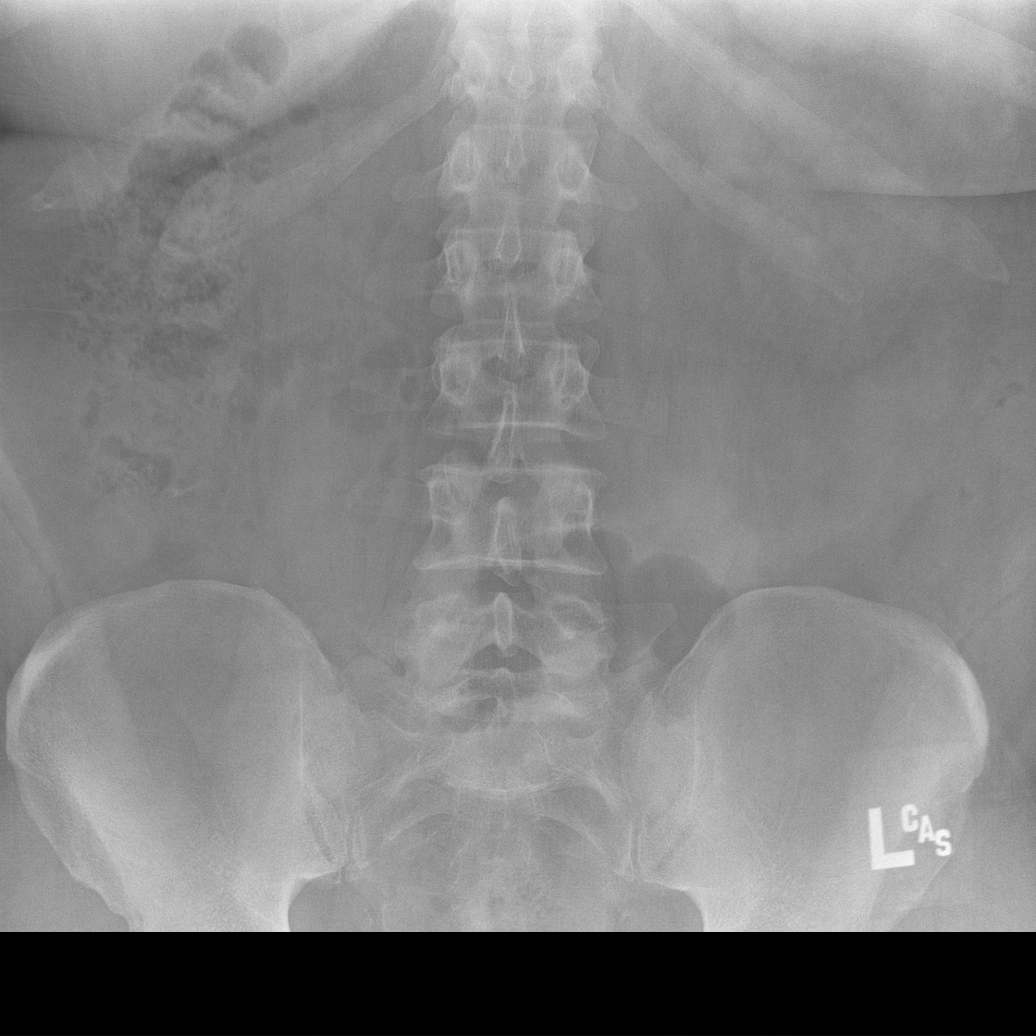
[im 4/4]
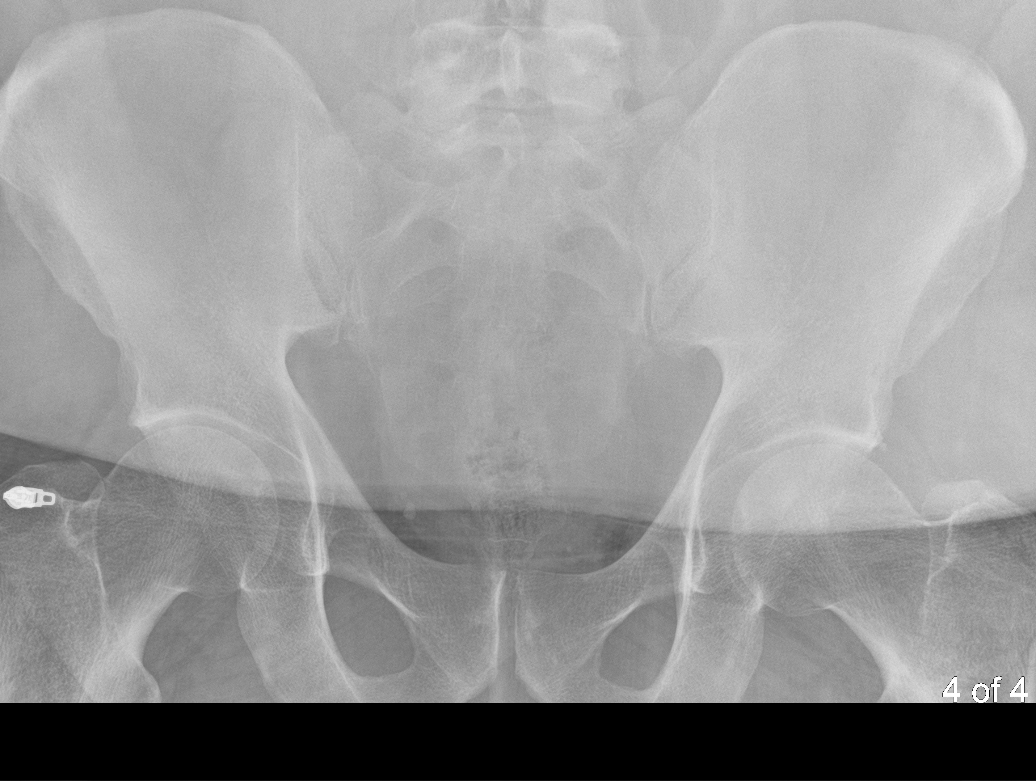

[4 of 4 positions shown; findings below may reference images not displayed]

FINDINGS: The bowel gas pattern is normal. No radio-opaque calculi or other
significant radiographic abnormality are seen. Bilateral pelvic
phleboliths are noted.
IMPRESSION: Negative.

## 2022-07-25 ENCOUNTER — Encounter: Payer: Self-pay | Admitting: Family Medicine

## 2022-07-25 ENCOUNTER — Other Ambulatory Visit: Payer: Self-pay

## 2022-07-25 DIAGNOSIS — K219 Gastro-esophageal reflux disease without esophagitis: Secondary | ICD-10-CM

## 2022-07-25 DIAGNOSIS — M62838 Other muscle spasm: Secondary | ICD-10-CM

## 2022-07-25 MED ORDER — GABAPENTIN 300 MG PO CAPS: 300.0000 mg | ORAL_CAPSULE | Freq: Three times a day (TID) | ORAL | 0 refills | Status: DC

## 2022-07-25 MED ORDER — FLUTICASONE FUROATE-VILANTEROL 200-25 MCG/ACT IN AEPB: 1.0000 | INHALATION_SPRAY | Freq: Every day | RESPIRATORY_TRACT | 4 refills | Status: DC

## 2022-07-25 MED ORDER — METHOCARBAMOL 500 MG PO TABS: ORAL_TABLET | ORAL | 0 refills | Status: DC

## 2022-07-25 MED ORDER — PANTOPRAZOLE SODIUM 40 MG PO TBEC: 40.0000 mg | DELAYED_RELEASE_TABLET | Freq: Every day | ORAL | 1 refills | Status: DC

## 2022-08-05 IMAGING — US US ABDOMEN COMPLETE
1 series · 14 of 25 positions shown · non-contrast
Comparison: Abdominal ultrasound 06/18/2018

CLINICAL DATA: Right flank pain

EXAM:
ABDOMEN ULTRASOUND COMPLETE

[Series 1: us abdomen complete · 0.27mm/px · 14 of 80 slices shown]
[im 1/80]
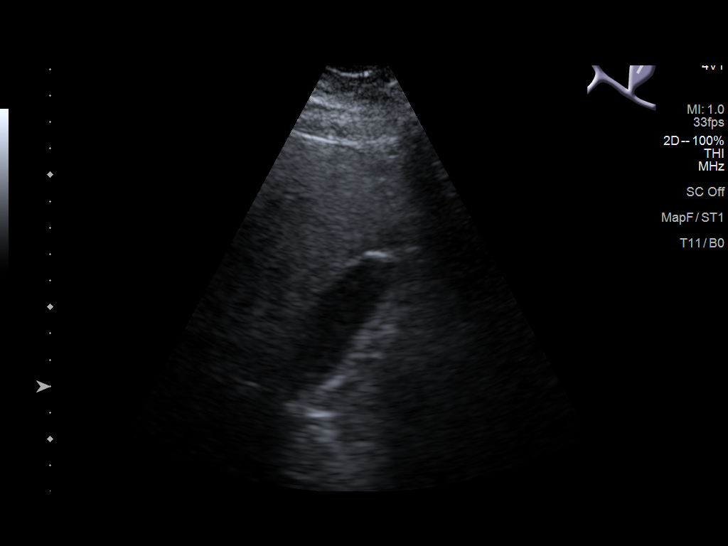
[im 7/80]
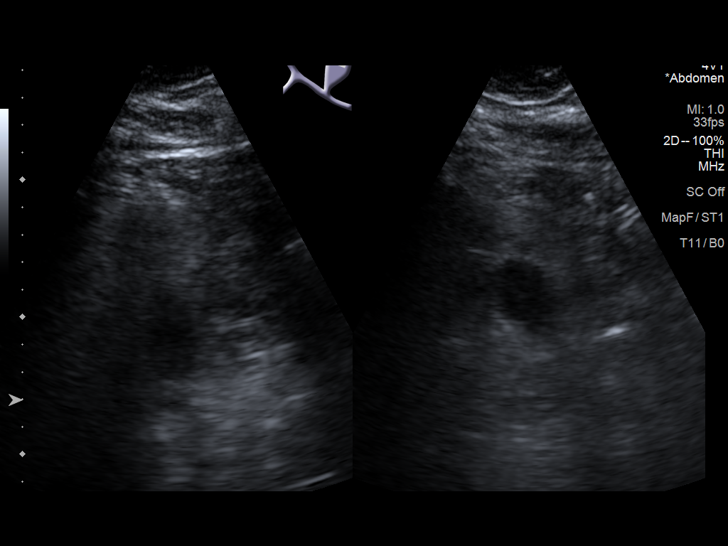
[im 14/80]
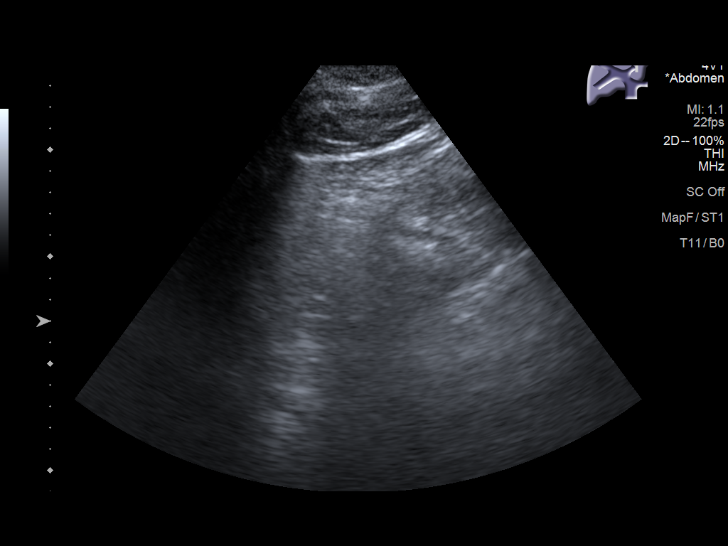
[im 20/80]
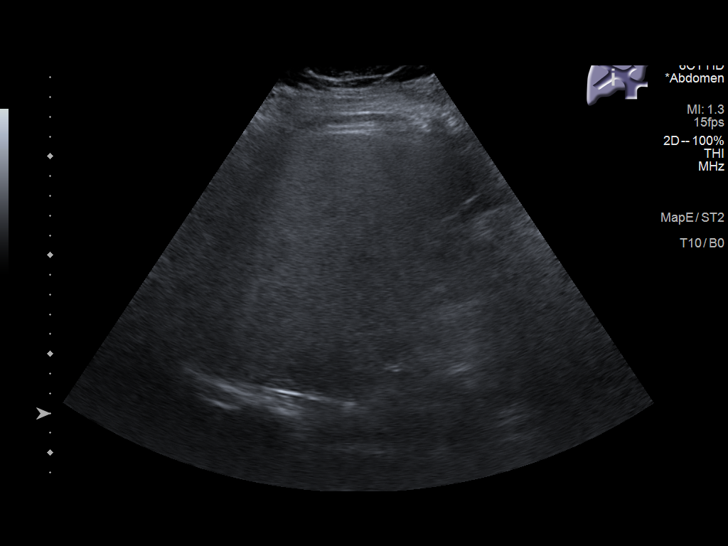
[im 27/80]
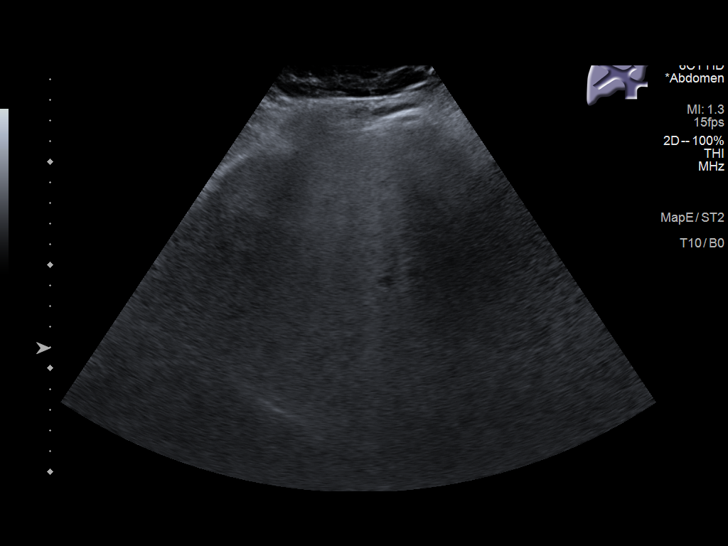
[im 30/80]
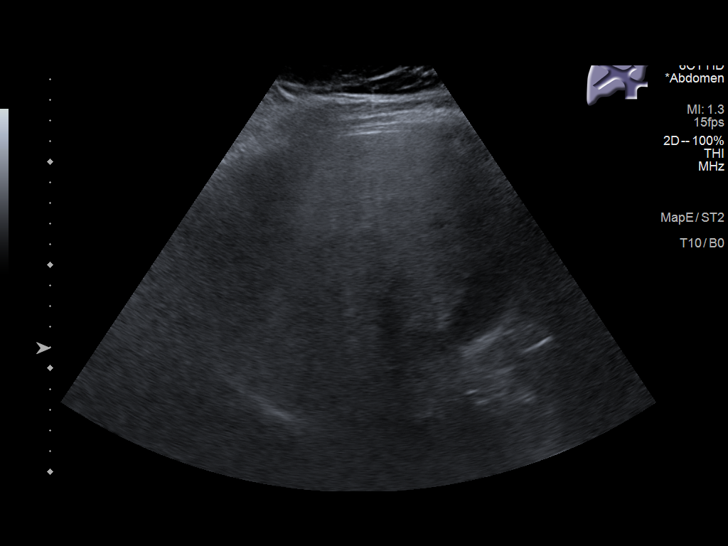
[im 37/80]
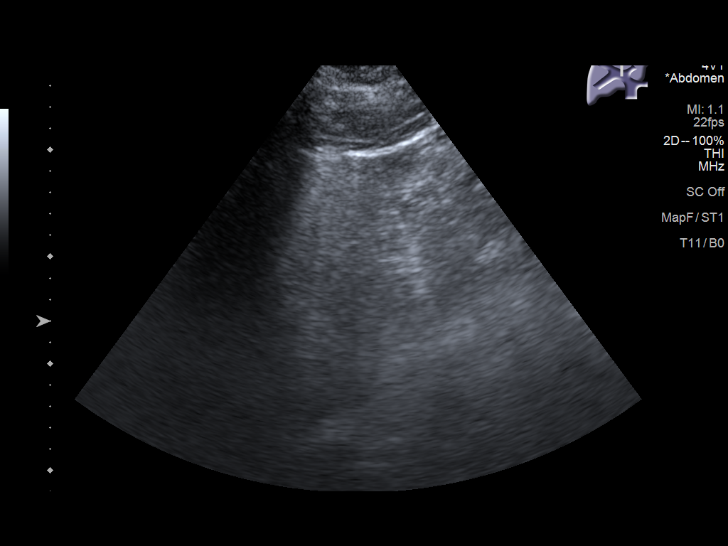
[im 43/80]
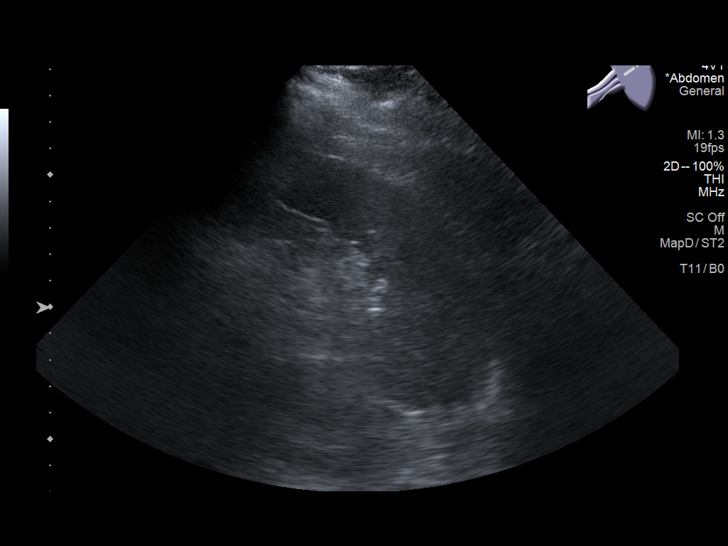
[im 50/80]
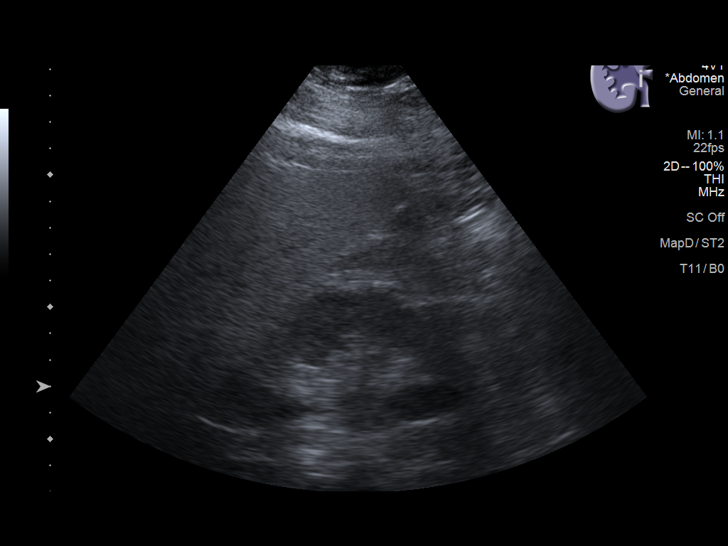
[im 53/80]
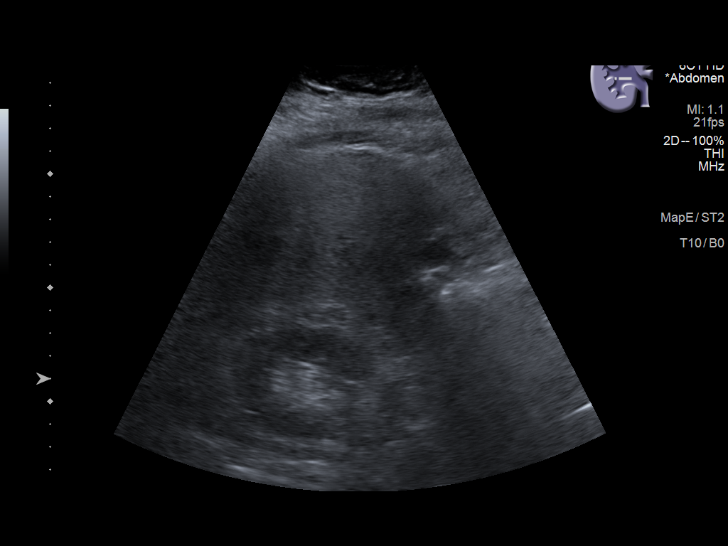
[im 60/80]
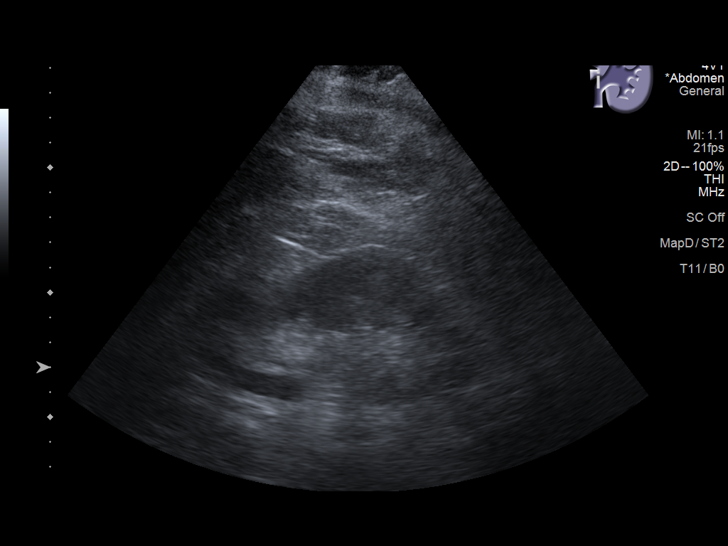
[im 66/80]
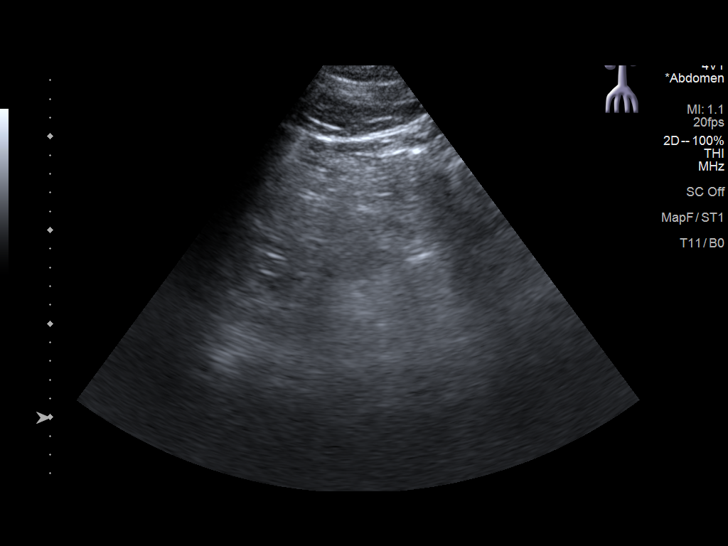
[im 73/80]
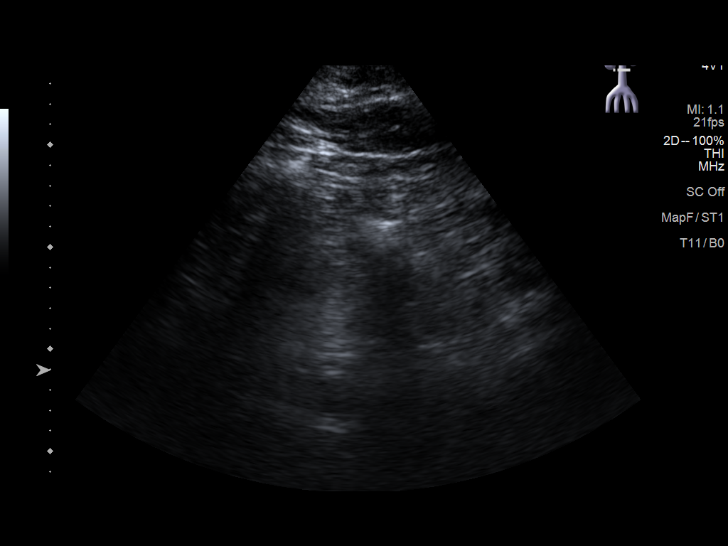
[im 80/80]
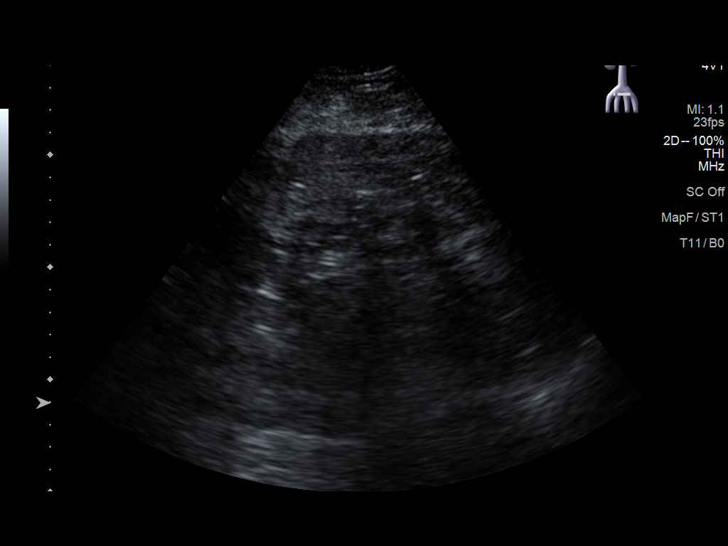

[14 of 25 positions shown; findings below may reference images not displayed]

FINDINGS: Gallbladder: No gallstones or wall thickening visualized. No
sonographic Murphy sign noted by sonographer.

Common bile duct: Diameter: 4 mm

Liver: Coarse, increased echogenicity of the parenchyma with no
focal mass identified. Portal vein is patent on color Doppler
imaging with normal direction of blood flow towards the liver.

IVC: Not well visualized.

Pancreas: Not well visualized.

Spleen: Size and appearance within normal limits.

Right Kidney: Length: 10.4 cm. Echogenicity within normal limits. No
mass or hydronephrosis visualized.

Left Kidney: Length: 11.4 cm. Echogenicity within normal limits. No
mass or hydronephrosis visualized.

Abdominal aorta: No aneurysm visualized.

Other findings: Limited study due to bowel gas.
IMPRESSION: Coarse, increased echogenicity of the liver parenchyma which may
represent hepatic steatosis and/or other hepatocellular disease.

## 2022-08-08 ENCOUNTER — Other Ambulatory Visit: Payer: Self-pay | Admitting: Family Medicine

## 2022-09-16 ENCOUNTER — Other Ambulatory Visit: Payer: Self-pay | Admitting: Family Medicine

## 2022-09-17 NOTE — Telephone Encounter (Signed)
Requested Prescriptions  Pending Prescriptions Disp Refills   gabapentin (NEURONTIN) 300 MG capsule [Pharmacy Med Name: GABAPENTIN CAP '300MG'$  (NEUR)] 270 capsule 3    Sig: TAKE 1 CAPSULE BY MOUTH 3 TIMES  DAILY     Neurology: Anticonvulsants - gabapentin Passed - 09/16/2022 10:04 PM      Passed - Cr in normal range and within 360 days    Creatinine  Date Value Ref Range Status  06/07/2013 0.97 0.60 - 1.30 mg/dL Final   Creatinine, Ser  Date Value Ref Range Status  05/17/2022 1.06 0.76 - 1.27 mg/dL Final         Passed - Completed PHQ-2 or PHQ-9 in the last 360 days      Passed - Valid encounter within last 12 months    Recent Outpatient Visits           4 months ago Annual physical exam   [place] Tally Joe T, FNP   9 months ago Right flank pain, chronic   [place] Eulas Post, MD   10 months ago Right flank pain, chronic   [place] Eulas Post, MD   1 year ago Annual physical exam   [facility] Eulas Post, MD   1 year ago Other chronic pain   [place] Eulas Post, MD       Future Appointments             In 8 months Gwyneth Sprout, [place], [facility]

## 2022-10-22 ENCOUNTER — Other Ambulatory Visit: Payer: Self-pay | Admitting: Family Medicine

## 2022-10-22 DIAGNOSIS — M62838 Other muscle spasm: Secondary | ICD-10-CM

## 2022-10-23 ENCOUNTER — Other Ambulatory Visit: Payer: Self-pay | Admitting: Family Medicine

## 2022-11-25 ENCOUNTER — Other Ambulatory Visit: Payer: Self-pay | Admitting: Family Medicine

## 2022-11-25 DIAGNOSIS — M62838 Other muscle spasm: Secondary | ICD-10-CM

## 2022-11-27 NOTE — Telephone Encounter (Signed)
Requested medication (s) are due for refill today: yes  Requested medication (s) are on the active medication list: yes    Last refill: 10/23/22 #90  0 refills  Future visit scheduled yes  05/22/23  Notes to clinic:Not delegated, please review. Thank you.   Requested Prescriptions  Pending Prescriptions Disp Refills   methocarbamol (ROBAXIN) 500 MG tablet [Pharmacy Med Name: METHOCARBAMOL 500 MG TABLET] 90 tablet 0    Sig: TAKE 1 TABLET BY MOUTH EVERY 8 HOURS AS NEEDED FOR MUSCLE SPASM     Not Delegated - Analgesics:  Muscle Relaxants Failed - 11/25/2022  8:43 AM      Failed - This refill cannot be delegated      Failed - Valid encounter within last 6 months    Recent Outpatient Visits           6 months ago Annual physical exam   [facility] Merita Norton T, FNP   11 months ago Right flank pain, chronic   [place] [facility] Bosie Clos, MD   1 year ago Right flank pain, chronic   [facility] Bosie Clos, MD   1 year ago Annual physical exam   [facility] Bosie Clos, MD   1 year ago Other chronic pain   [place] [facility] Bosie Clos, MD       Future Appointments             In 5 months Jacky Kindle, FNP [facility], [facility]

## 2022-12-23 ENCOUNTER — Other Ambulatory Visit: Payer: Self-pay | Admitting: Family Medicine

## 2022-12-23 DIAGNOSIS — M62838 Other muscle spasm: Secondary | ICD-10-CM

## 2023-01-29 ENCOUNTER — Other Ambulatory Visit: Payer: Self-pay | Admitting: Family Medicine

## 2023-01-29 DIAGNOSIS — M62838 Other muscle spasm: Secondary | ICD-10-CM

## 2023-01-30 NOTE — Telephone Encounter (Signed)
Requested medication (s) are due for refill today: yes  Requested medication (s) are on the active medication list: yes  Last refill:  12/14/22 #90   Future visit scheduled:yes  Notes to clinic:  med not delegated to NT to RF    Requested Prescriptions  Pending Prescriptions Disp Refills   methocarbamol (ROBAXIN) 500 MG tablet [Pharmacy Med Name: METHOCARBAMOL 500 MG TABLET] 90 tablet 0    Sig: TAKE 1 TABLET BY MOUTH EVERY 8 HOURS AS NEEDED FOR MUSCLE SPASM     Not Delegated - Analgesics:  Muscle Relaxants Failed - 01/29/2023  2:35 AM      Failed - This refill cannot be delegated      Failed - Valid encounter within last 6 months    Recent Outpatient Visits           8 months ago Annual physical exam   [facility] Merita Norton T, FNP   1 year ago Right flank pain, chronic   [facility] Bosie Clos, MD   1 year ago Right flank pain, chronic   [facility] Bosie Clos, MD   1 year ago Annual physical exam   [facility] Bosie Clos, MD   1 year ago Other chronic pain   [place] [facility] Bosie Clos, MD       Future Appointments             In 3 months Jacky Kindle, FNP [facility], [facility]

## 2023-04-25 ENCOUNTER — Other Ambulatory Visit: Payer: Self-pay | Admitting: Family Medicine

## 2023-04-25 DIAGNOSIS — K219 Gastro-esophageal reflux disease without esophagitis: Secondary | ICD-10-CM

## 2023-04-26 ENCOUNTER — Other Ambulatory Visit: Payer: Self-pay | Admitting: Family Medicine

## 2023-04-26 ENCOUNTER — Other Ambulatory Visit: Payer: Self-pay

## 2023-04-26 ENCOUNTER — Other Ambulatory Visit (HOSPITAL_COMMUNITY): Payer: Self-pay

## 2023-04-26 DIAGNOSIS — M62838 Other muscle spasm: Secondary | ICD-10-CM

## 2023-04-26 MED ORDER — METHOCARBAMOL 500 MG PO TABS
500.0000 mg | ORAL_TABLET | Freq: Three times a day (TID) | ORAL | 0 refills | Status: DC | PRN
  Filled 2023-04-26: qty 90, 30d supply, fill #0

## 2023-04-26 NOTE — Telephone Encounter (Signed)
Requested Prescriptions  Pending Prescriptions Disp Refills   pantoprazole (PROTONIX) 40 MG tablet [Pharmacy Med Name: Pantoprazole Sodium 40 MG Oral Tablet Delayed Release] 90 tablet 0    Sig: TAKE 1 TABLET BY MOUTH DAILY     Gastroenterology: Proton Pump Inhibitors Passed - 04/25/2023  6:40 PM      Passed - Valid encounter within last 12 months    Recent Outpatient Visits           11 months ago Annual physical exam   [facility] Merita Norton T, FNP   1 year ago Right flank pain, chronic   [facility] Bosie Clos, MD   1 year ago Right flank pain, chronic   [facility] Bosie Clos, MD   1 year ago Annual physical exam   [facility] Bosie Clos, MD   2 years ago Other chronic pain   [facility] Bosie Clos, MD       Future Appointments             In 3 weeks Jacky Kindle, FNP [facility]            Refused Prescriptions Disp Refills   gabapentin (NEURONTIN) 300 MG capsule [Pharmacy Med Name: GABAPENTIN CAP 300MG  (NEUR)] 270 capsule 3    Sig: TAKE 1 CAPSULE BY MOUTH 3 TIMES  DAILY     Neurology: Anticonvulsants - gabapentin Passed - 04/25/2023  6:40 PM      Passed - Cr in normal range and within 360 days    Creatinine  Date Value Ref Range Status  06/07/2013 0.97 0.60 - 1.30 mg/dL Final   Creatinine, Ser  Date Value Ref Range Status  05/17/2022 1.06 0.76 - 1.27 mg/dL Final         Passed - Completed PHQ-2 or PHQ-9 in the last 360 days      Passed - Valid encounter within last 12 months    Recent Outpatient Visits           11 months ago Annual physical exam   [facility] Merita Norton T, FNP   1 year ago Right flank pain, chronic   [facility] Bosie Clos, MD   1 year ago Right flank pain, chronic   [facility] Bosie Clos, MD   1 year ago Annual physical exam   [facility] Bosie Clos, MD   2 years ago Other chronic pain   [facility] Bosie Clos, MD       Future Appointments             In 3 weeks Jacky Kindle, FNP [facility], [facility]

## 2023-05-22 ENCOUNTER — Encounter: Payer: Self-pay | Admitting: Family Medicine

## 2023-05-22 ENCOUNTER — Ambulatory Visit (INDEPENDENT_AMBULATORY_CARE_PROVIDER_SITE_OTHER): Payer: 59 | Admitting: Family Medicine

## 2023-05-22 VITALS — BP 126/87 | HR 75 | Ht 71.0 in | Wt 283.0 lb

## 2023-05-22 DIAGNOSIS — I10 Essential (primary) hypertension: Secondary | ICD-10-CM | POA: Insufficient documentation

## 2023-05-22 DIAGNOSIS — Z23 Encounter for immunization: Secondary | ICD-10-CM

## 2023-05-22 DIAGNOSIS — G2581 Restless legs syndrome: Secondary | ICD-10-CM | POA: Diagnosis not present

## 2023-05-22 DIAGNOSIS — K219 Gastro-esophageal reflux disease without esophagitis: Secondary | ICD-10-CM | POA: Diagnosis not present

## 2023-05-22 DIAGNOSIS — F39 Unspecified mood [affective] disorder: Secondary | ICD-10-CM

## 2023-05-22 DIAGNOSIS — Z1211 Encounter for screening for malignant neoplasm of colon: Secondary | ICD-10-CM

## 2023-05-22 DIAGNOSIS — Z125 Encounter for screening for malignant neoplasm of prostate: Secondary | ICD-10-CM

## 2023-05-22 DIAGNOSIS — E782 Mixed hyperlipidemia: Secondary | ICD-10-CM | POA: Insufficient documentation

## 2023-05-22 DIAGNOSIS — Z0000 Encounter for general adult medical examination without abnormal findings: Secondary | ICD-10-CM | POA: Diagnosis not present

## 2023-05-22 DIAGNOSIS — E559 Vitamin D deficiency, unspecified: Secondary | ICD-10-CM

## 2023-05-22 MED ORDER — FLUOXETINE HCL 20 MG PO CAPS: 20.0000 mg | ORAL_CAPSULE | Freq: Every day | ORAL | 3 refills | Status: DC

## 2023-05-22 MED ORDER — LORAZEPAM 0.5 MG PO TABS: 0.5000 mg | ORAL_TABLET | Freq: Two times a day (BID) | ORAL | 0 refills | Status: DC | PRN

## 2023-05-22 MED ORDER — TIRZEPATIDE-WEIGHT MANAGEMENT 2.5 MG/0.5ML ~~LOC~~ SOAJ: 2.5000 mg | SUBCUTANEOUS | 0 refills | Status: DC

## 2023-05-22 NOTE — Progress Notes (Signed)
Complete physical exam   Patient: Francisco Oneal   DOB: 1971/02/07   52 y.o. Male  MRN: [id number] Visit Date: 05/22/2023  Today's healthcare provider: Jacky Kindle, FNP  Re Introduced to nurse practitioner role and practice setting.  All questions answered.  Discussed provider/patient relationship and expectations.  Chief Complaint  Patient presents with   Annual Exam   Subjective    Francisco Oneal is a 52 y.o. male who presents today for a complete physical exam.  He reports consuming a general diet. The patient does not participate in regular exercise at present. He generally feels fairly well. He reports sleeping poorly. He does have additional problems to discuss today.   HPI HPI   Pt stated--Wellbutrin not working, but would like try Rx prozac and alprazolam. Weight management Last edited by Shelly Bombard, CMA on 05/22/2023  8:56 AM.      No past medical history on file. Past Surgical History:  Procedure Laterality Date   APPENDECTOMY     INCISION AND DRAINAGE OF WOUND Left 12/08/2014   Procedure: IRRIGATION AND DEBRIDEMENT WOUND;  Surgeon: Christena Flake, MD;  Location: [facility] ORS;  Service: Orthopedics;  Laterality: Left;   OPEN REDUCTION INTERNAL FIXATION (ORIF) DISTAL RADIAL FRACTURE Left 12/08/2014   Procedure: OPEN REDUCTION INTERNAL FIXATION (ORIF) DISTAL RADIAL FRACTURE;  Surgeon: Christena Flake, MD;  Location: [facility] ORS;  Service: Orthopedics;  Laterality: Left;   Social History   Socioeconomic History   Marital status: Married    Spouse name: Not on file   Number of children: Not on file   Years of education: Not on file   Highest education level: Not on file  Occupational History   Not on file  Tobacco Use   Smoking status: Never    Passive exposure: Never   Smokeless tobacco: Never  Substance and Sexual Activity   Alcohol use: Yes    Alcohol/week: 8.0 standard drinks of alcohol    Types: 8 Glasses of wine per week   Drug use: Never   Sexual activity: Yes   Other Topics Concern   Not on file  Social History Narrative   Not on file   Social Determinants of Health   Financial Resource Strain: Not on file  Food Insecurity: Not on file  Transportation Needs: Not on file  Physical Activity: Not on file  Stress: Not on file  Social Connections: Not on file  Intimate Partner Violence: Not on file   Family Status  Relation Name Status   Mother  Alive   Father  Alive   Son  Alive  No partnership data on file   Family History  Problem Relation Age of Onset   Emphysema Mother    Cancer Mother    Allergies  Allergen Reactions   Bee Venom Anaphylaxis   Penicillins Other (See Comments)    Has patient had a PCN reaction causing immediate rash, facial/tongue/throat swelling, SOB or lightheadedness with hypotension: Yes Has patient had a PCN reaction causing severe rash involving mucus membranes or skin necrosis: Yes Has patient had a PCN reaction that required hospitalization: Yes Has patient had a PCN reaction occurring within the last 10 years: No If all of the above answers are "NO", then may proceed with Cephalosporin use.     Patient Care Team: Jacky Kindle, FNP as PCP - General (Family Medicine)   Medications: Outpatient Medications Prior to Visit  Medication Sig   fluticasone furoate-vilanterol (BREO ELLIPTA) 200-25  MCG/ACT AEPB Inhale 1 puff into the lungs daily.   gabapentin (NEURONTIN) 300 MG capsule TAKE 1 CAPSULE BY MOUTH 3 TIMES  DAILY   methocarbamol (ROBAXIN) 500 MG tablet Take 1 tablet (500 mg total) by mouth every 8 (eight) hours as needed for muscle spasms.   pantoprazole (PROTONIX) 40 MG tablet TAKE 1 TABLET BY MOUTH DAILY   No facility-administered medications prior to visit.    Objective    BP 126/87 (BP Location: Right Arm, Patient Position: Sitting, Cuff Size: Large)   Pulse 75   Ht 5\' 11"  (1.803 m)   Wt 283 lb (128.4 kg)   SpO2 97%   BMI 39.47 kg/m   Physical Exam Vitals and nursing note  reviewed.  Constitutional:      General: He is awake. He is not in acute distress.    Appearance: Normal appearance. He is well-developed and well-groomed. He is obese. He is not ill-appearing, toxic-appearing or diaphoretic.  HENT:     Head: Normocephalic and atraumatic.     Jaw: There is normal jaw occlusion. No trismus, tenderness, swelling or pain on movement.     Salivary Glands: Right salivary gland is not diffusely enlarged or tender. Left salivary gland is not diffusely enlarged or tender.     Right Ear: Hearing, tympanic membrane, ear canal and external ear normal. There is no impacted cerumen.     Left Ear: Hearing, tympanic membrane, ear canal and external ear normal. There is no impacted cerumen.     Nose: Nose normal. No congestion or rhinorrhea.     Right Turbinates: Not enlarged, swollen or pale.     Left Turbinates: Not enlarged, swollen or pale.     Right Sinus: No maxillary sinus tenderness or frontal sinus tenderness.     Left Sinus: No maxillary sinus tenderness or frontal sinus tenderness.     Mouth/Throat:     Lips: Pink.     Mouth: Mucous membranes are moist. No injury, lacerations, oral lesions or angioedema.     Pharynx: Oropharynx is clear. Uvula midline. No pharyngeal swelling, oropharyngeal exudate or posterior oropharyngeal erythema.     Tonsils: No tonsillar exudate or tonsillar abscesses.  Eyes:     General: Lids are normal. Vision grossly intact. Gaze aligned appropriately.        Right eye: No discharge.        Left eye: No discharge.     Extraocular Movements: Extraocular movements intact.     Conjunctiva/sclera: Conjunctivae normal.     Pupils: Pupils are equal, round, and reactive to light.  Neck:     Thyroid: No thyroid mass, thyromegaly or thyroid tenderness.     Vascular: No carotid bruit.     Trachea: Trachea normal. No tracheal tenderness.  Cardiovascular:     Rate and Rhythm: Normal rate and regular rhythm.     Pulses: Normal pulses.           Carotid pulses are 2+ on the right side and 2+ on the left side.      Radial pulses are 2+ on the right side and 2+ on the left side.       Femoral pulses are 2+ on the right side and 2+ on the left side.      Popliteal pulses are 2+ on the right side and 2+ on the left side.       Dorsalis pedis pulses are 2+ on the right side and 2+ on the left side.  Posterior tibial pulses are 2+ on the right side and 2+ on the left side.     Heart sounds: Normal heart sounds, S1 normal and S2 normal. No murmur heard.    No friction rub. No gallop.  Pulmonary:     Effort: Pulmonary effort is normal. No respiratory distress.     Breath sounds: Normal breath sounds and air entry. No stridor. No wheezing, rhonchi or rales.  Chest:     Chest wall: No tenderness.  Abdominal:     General: Abdomen is flat. Bowel sounds are normal. There is no distension.     Palpations: Abdomen is soft. There is no mass.     Tenderness: There is no abdominal tenderness. There is no guarding or rebound.     Hernia: No hernia is present.  Genitourinary:    Comments: Exam deferred; denies complaints Musculoskeletal:        General: No swelling, tenderness, deformity or signs of injury. Normal range of motion.     Cervical back: Normal range of motion and neck supple. No rigidity or tenderness.     Right lower leg: No edema.     Left lower leg: No edema.  Lymphadenopathy:     Cervical: No cervical adenopathy.     Right cervical: No superficial, deep or posterior cervical adenopathy.    Left cervical: No superficial, deep or posterior cervical adenopathy.  Skin:    General: Skin is warm and dry.     Capillary Refill: Capillary refill takes less than 2 seconds.     Coloration: Skin is not jaundiced or pale.     Findings: No bruising, erythema, lesion or rash.  Neurological:     General: No focal deficit present.     Mental Status: He is alert and oriented to person, place, and time. Mental status is at baseline.      GCS: GCS eye subscore is 4. GCS verbal subscore is 5. GCS motor subscore is 6.     Sensory: Sensation is intact. No sensory deficit.     Motor: Motor function is intact. No weakness.     Coordination: Coordination is intact.     Gait: Gait is intact.  Psychiatric:        Attention and Perception: Attention and perception normal.        Mood and Affect: Mood and affect normal.        Speech: Speech normal.        Behavior: Behavior normal. Behavior is cooperative.        Thought Content: Thought content normal.        Cognition and Memory: Cognition normal.        Judgment: Judgment normal.     Last depression screening scores    05/22/2023    8:56 AM 05/17/2022    8:46 AM 12/13/2021    8:56 AM  PHQ 2/9 Scores  PHQ - 2 Score 0 0 0  PHQ- 9 Score 1 0 1   Last fall risk screening    05/22/2023    8:56 AM  Fall Risk   Falls in the past year? 0  Number falls in past yr: 0  Injury with Fall? 0   Last Audit-C alcohol use screening    05/17/2022    8:46 AM  Alcohol Use Disorder Test (AUDIT)  1. How often do you have a drink containing alcohol? 3  2. How many drinks containing alcohol do you have on a typical day when you are  drinking? 0  3. How often do you have six or more drinks on one occasion? 1  AUDIT-C Score 4   A score of 3 or more in women, and 4 or more in men indicates increased risk for alcohol abuse, EXCEPT if all of the points are from question 1   No results found for any visits on 05/22/23.  Assessment & Plan    Routine Health Maintenance and Physical Exam  Exercise Activities and Dietary recommendations  Goals   None     Immunization History  Administered Date(s) Administered   Influenza,inj,Quad PF,6+ Mos 04/09/2016, 06/13/2018, 04/29/2020, 04/19/2021, 05/17/2022   Influenza-Unspecified 02/25/2019, 04/29/2020   Moderna Sars-Covid-2 Vaccination 10/08/2019, 11/05/2019, 06/24/2020   Td 11/27/2012   Tdap 11/27/2012    Health Maintenance  Topic Date  Due   Zoster Vaccines- Shingrix (1 of 2) Never done   DTaP/Tdap/Td (3 - Td or Tdap) 11/28/2022   INFLUENZA VACCINE  02/07/2023   COVID-19 Vaccine (4 - 2023-24 season) 03/10/2023   Fecal DNA (Cologuard)  05/07/2023   Hepatitis C Screening  Completed   HIV Screening  Completed   HPV VACCINES  Aged Out    Discussed health benefits of physical activity, and encouraged him to engage in regular exercise appropriate for his age and condition.  Problem List Items Addressed This Visit       Cardiovascular and Mediastinum   Primary hypertension     Digestive   Gastroesophageal reflux disease without esophagitis     Other   Annual physical exam - Primary   Mixed hyperlipidemia   Morbid obesity (HCC)   RLS (restless legs syndrome)   Other Visit Diagnoses     Mood disorder (HCC)       Screen for colon cancer       Encounter for screening prostate specific antigen (PSA) measurement          No follow-ups on file.    Leilani Merl, FNP, have reviewed all documentation for this visit. The documentation on 05/22/23 for the exam, diagnosis, procedures, and orders are all accurate and complete.  Jacky Kindle, FNP  [facility] [phone] (phone) [phone] (fax)  [facility]

## 2023-05-23 ENCOUNTER — Other Ambulatory Visit: Payer: Self-pay | Admitting: Family Medicine

## 2023-05-23 LAB — LIPID PANEL
Chol/HDL Ratio: 4.5 ratio (ref 0.0–5.0)
Cholesterol, Total: 256 mg/dL — ABNORMAL HIGH (ref 100–199)
HDL: 57 mg/dL (ref >39)
LDL Chol Calc (NIH): 171 mg/dL — ABNORMAL HIGH (ref 0–99)
Triglycerides: 152 mg/dL — ABNORMAL HIGH (ref 0–149)
VLDL Cholesterol Cal: 28 mg/dL (ref 5–40)

## 2023-05-23 LAB — CBC WITH DIFFERENTIAL/PLATELET
Basophils Absolute: 0 10*3/uL (ref 0.0–0.2)
Basos: 0 %
EOS (ABSOLUTE): 0.1 10*3/uL (ref 0.0–0.4)
Eos: 2 %
Hematocrit: 49.3 % (ref 37.5–51.0)
Hemoglobin: 16.4 g/dL (ref 13.0–17.7)
Immature Grans (Abs): 0 10*3/uL (ref 0.0–0.1)
Immature Granulocytes: 1 %
Lymphocytes Absolute: 3.2 10*3/uL — ABNORMAL HIGH (ref 0.7–3.1)
Lymphs: 39 %
MCH: 31.5 pg (ref 26.6–33.0)
MCHC: 33.3 g/dL (ref 31.5–35.7)
MCV: 95 fL (ref 79–97)
Monocytes Absolute: 0.8 10*3/uL (ref 0.1–0.9)
Monocytes: 10 %
Neutrophils Absolute: 3.9 10*3/uL (ref 1.4–7.0)
Neutrophils: 48 %
Platelets: 219 10*3/uL (ref 150–450)
RBC: 5.21 x10E6/uL (ref 4.14–5.80)
RDW: 12.4 % (ref 11.6–15.4)
WBC: 8.1 10*3/uL (ref 3.4–10.8)

## 2023-05-23 LAB — COMPREHENSIVE METABOLIC PANEL
ALT: 20 [IU]/L (ref 0–44)
AST: 21 [IU]/L (ref 0–40)
Albumin: 4.7 g/dL (ref 3.8–4.9)
Alkaline Phosphatase: 53 [IU]/L (ref 44–121)
BUN/Creatinine Ratio: 20 (ref 9–20)
BUN: 21 mg/dL (ref 6–24)
Bilirubin Total: 0.5 mg/dL (ref 0.0–1.2)
CO2: 23 mmol/L (ref 20–29)
Calcium: 9.6 mg/dL (ref 8.7–10.2)
Chloride: 100 mmol/L (ref 96–106)
Creatinine, Ser: 1.05 mg/dL (ref 0.76–1.27)
Globulin, Total: 2.9 g/dL (ref 1.5–4.5)
Glucose: 92 mg/dL (ref 70–99)
Potassium: 4.9 mmol/L (ref 3.5–5.2)
Sodium: 140 mmol/L (ref 134–144)
Total Protein: 7.6 g/dL (ref 6.0–8.5)
eGFR: 85 mL/min/{1.73_m2} (ref >59)

## 2023-05-23 LAB — VITAMIN D 25 HYDROXY (VIT D DEFICIENCY, FRACTURES): Vit D, 25-Hydroxy: 23.3 ng/mL — ABNORMAL LOW (ref 30.0–100.0)

## 2023-05-23 LAB — TSH: TSH: 3.04 u[IU]/mL (ref 0.450–4.500)

## 2023-05-23 LAB — MAGNESIUM: Magnesium: 2.4 mg/dL — ABNORMAL HIGH (ref 1.6–2.3)

## 2023-05-23 LAB — HEMOGLOBIN A1C
Est. average glucose Bld gHb Est-mCnc: 105 mg/dL
Hgb A1c MFr Bld: 5.3 % (ref 4.8–5.6)

## 2023-05-23 LAB — PSA: Prostate Specific Ag, Serum: 0.6 ng/mL (ref 0.0–4.0)

## 2023-05-23 MED ORDER — ROSUVASTATIN CALCIUM 10 MG PO TABS: 10.0000 mg | ORAL_TABLET | Freq: Every day | ORAL | 3 refills | Status: DC

## 2023-05-23 MED ORDER — VITAMIN D (ERGOCALCIFEROL) 1.25 MG (50000 UNIT) PO CAPS: 50000.0000 [IU] | ORAL_CAPSULE | ORAL | 1 refills | Status: DC

## 2023-05-23 NOTE — Progress Notes (Signed)
Labs show increased cholesterol; The 10-year ASCVD risk score (Arnett DK, et al., 2019) is: 5.4% Recommendation is to start low fat diet, routine exercise with goal of 150-300 min/week as well as statin medication.  Recommend restart of Vit d supplementation at 5000 IU daily OTC vs 50,000 IU Rx once weekly.

## 2023-05-26 DIAGNOSIS — F39 Unspecified mood [affective] disorder: Secondary | ICD-10-CM | POA: Insufficient documentation

## 2023-05-26 DIAGNOSIS — Z23 Encounter for immunization: Secondary | ICD-10-CM | POA: Insufficient documentation

## 2023-05-26 DIAGNOSIS — E559 Vitamin D deficiency, unspecified: Secondary | ICD-10-CM | POA: Insufficient documentation

## 2023-05-26 DIAGNOSIS — Z125 Encounter for screening for malignant neoplasm of prostate: Secondary | ICD-10-CM | POA: Insufficient documentation

## 2023-05-26 DIAGNOSIS — Z1211 Encounter for screening for malignant neoplasm of colon: Secondary | ICD-10-CM | POA: Insufficient documentation

## 2023-05-26 NOTE — Assessment & Plan Note (Signed)
Chronic, Body mass index is 39.47 kg/m. Wishes to start on zepbound to assist diet/exercise

## 2023-05-26 NOTE — Assessment & Plan Note (Addendum)
Chronic, remains in Stage I category Patient continues to wish to work on diet/exercise Declines medication start Goal remains 119/79

## 2023-05-26 NOTE — Assessment & Plan Note (Signed)
Denies LUTS; recommend PSA in place of DRE. If PSA is elevated for age, we will repeat; if PSA remains elevated pt will be referred to urology for DRE and next steps for best treatment.  

## 2023-05-26 NOTE — Assessment & Plan Note (Signed)
Chronic, repeat lab given ongoing mood changes

## 2023-05-26 NOTE — Assessment & Plan Note (Signed)

## 2023-05-26 NOTE — Assessment & Plan Note (Signed)
Chronic, stable Continues on protonix 40 mg daily

## 2023-05-26 NOTE — Assessment & Plan Note (Signed)
Chronic, The 10-year ASCVD risk score (Arnett DK, et al., 2019) is: 5.4% Repeat LP At this time patient wishes to continue heart healthy diet and routine exercise

## 2023-05-26 NOTE — Assessment & Plan Note (Signed)
Chronic, variable Unclear of associated to increased anxiety and mood symptoms Previously noted on gaba 300 mg TID as well as PRN robaxin 500

## 2023-05-26 NOTE — Assessment & Plan Note (Signed)
Chronic, worsening Reports failure of wellbutrin and wishes to restart on prozac with low dose benzo to assist Pt is aware of risks of psychoactive medication use to include increased sedation, respiratory suppression, falls, extrapyramidal movements,  dependence and cardiovascular events.  Pt would like to continue treatment as benefit determined to outweigh risk.

## 2023-05-29 ENCOUNTER — Encounter: Payer: Self-pay | Admitting: Family Medicine

## 2023-05-31 ENCOUNTER — Other Ambulatory Visit: Payer: Self-pay | Admitting: Family Medicine

## 2023-05-31 MED ORDER — VITAMIN D (ERGOCALCIFEROL) 1.25 MG (50000 UNIT) PO CAPS: 50000.0000 [IU] | ORAL_CAPSULE | ORAL | 1 refills | Status: DC

## 2023-05-31 MED ORDER — ROSUVASTATIN CALCIUM 10 MG PO TABS: 10.0000 mg | ORAL_TABLET | Freq: Every day | ORAL | 3 refills | Status: DC

## 2023-06-06 LAB — COLOGUARD: COLOGUARD: NEGATIVE

## 2023-06-09 ENCOUNTER — Other Ambulatory Visit: Payer: Self-pay | Admitting: Family Medicine

## 2023-07-03 ENCOUNTER — Other Ambulatory Visit: Payer: Self-pay | Admitting: Family Medicine

## 2023-07-03 DIAGNOSIS — K219 Gastro-esophageal reflux disease without esophagitis: Secondary | ICD-10-CM

## 2023-07-11 ENCOUNTER — Encounter: Payer: Self-pay | Admitting: Family Medicine

## 2023-07-23 ENCOUNTER — Telehealth: Payer: 59 | Admitting: Family Medicine

## 2023-07-23 ENCOUNTER — Encounter: Payer: Self-pay | Admitting: Family Medicine

## 2023-07-23 DIAGNOSIS — R052 Subacute cough: Secondary | ICD-10-CM | POA: Diagnosis not present

## 2023-07-23 DIAGNOSIS — E782 Mixed hyperlipidemia: Secondary | ICD-10-CM | POA: Diagnosis not present

## 2023-07-23 DIAGNOSIS — J4531 Mild persistent asthma with (acute) exacerbation: Secondary | ICD-10-CM

## 2023-07-23 DIAGNOSIS — F411 Generalized anxiety disorder: Secondary | ICD-10-CM

## 2023-07-23 DIAGNOSIS — J45909 Unspecified asthma, uncomplicated: Secondary | ICD-10-CM | POA: Insufficient documentation

## 2023-07-23 MED ORDER — BENZONATATE 100 MG PO CAPS: 100.0000 mg | ORAL_CAPSULE | Freq: Two times a day (BID) | ORAL | 0 refills | Status: DC | PRN

## 2023-07-23 MED ORDER — FLUOXETINE HCL 40 MG PO CAPS: 40.0000 mg | ORAL_CAPSULE | Freq: Every day | ORAL | 1 refills | Status: DC

## 2023-07-23 MED ORDER — ALBUTEROL SULFATE HFA 108 (90 BASE) MCG/ACT IN AERS: 2.0000 | INHALATION_SPRAY | Freq: Four times a day (QID) | RESPIRATORY_TRACT | 0 refills | Status: DC | PRN

## 2023-07-23 NOTE — Assessment & Plan Note (Signed)
 Appears to be post viral cough lingering -Tessalon 100 mg BID prn -Added as needed albuterol  Continue hot tea with honey, cough drops, humidification and increase daily water intake

## 2023-07-23 NOTE — Assessment & Plan Note (Signed)
 Improvement noted on Prozac 20mg  daily, but still experiencing some irritability and anxiety, particularly in the evenings. -Increase Prozac to 40mg  daily. -Reassess in 4-6 weeks.

## 2023-07-23 NOTE — Assessment & Plan Note (Signed)
 Chronic Started on Crestr 10mg  in [place] 2024 Will recheck lipid panel fasting in 6 week f/u Continue low saturate fat and processed food diet Increase daily exercise.

## 2023-07-23 NOTE — Progress Notes (Signed)
 Virtual Visit via Video Note  I connected with Francisco Oneal on 07/23/23 at 11:00 AM EST by a video enabled telemedicine application and verified that I am speaking with the correct person using two identifiers.  Patient Location: Home Provider Location: Office/Clinic  I discussed the limitations, risks, security, and privacy concerns of performing an evaluation and management service by video and the availability of in person appointments. I also discussed with the patient that there may be a patient responsible charge related to this service. The patient expressed understanding and agreed to proceed.  Subjective: PCP: Wellington Curtis LABOR, FNP  Chief Complaint  Patient presents with   Cough   Anxiety  The patient, a 53 year old male with a history of chronic respiratory issues, HLD, axienty, RLS, GERD, and HTN, presents with a persistent cough for several weeks and mood f/u.   The cough initially presented with a severe cold, characterized by a runny nose and congestion, which resolved after a week or two. However, the cough persisted and was exacerbated by a subsequent illness. The patient reports that the cough is worse in the mornings and evenings, particularly when at rest, but improves with activity.Managing the cough with a daily dose of Breo Ellipta , which he reports has been somewhat effective. He has not been taking any other medications for the cough, as he prefers to avoid excessive medication use. The patient describes the cough as a tickling sensation in the throat, akin to a persistent itch or scratch. He has previously taken Mucinex during the acute phase of his illness, but discontinued due to the side effect of excessive tiredness.  Anxiety:Started on Prozac  20mg  daily (fluoxetine ) in November 2024, which his wife reports has led to significant improvement. However, the patient reports that his anxiety and irritability tends to increase in the evenings - may be wearing off  towards the end of the day. Manages night anxiety with as needed Ativan  0.5mg .  RLS - which he manages with as needed Robaxin  500mg  TID  The patient's cholesterol levels have been slightly elevated, and was prescribed Crestor  10mg .    He reports that his blood pressure is typically a little high when measured at the doctor's office, but is normal when checked at home (110s/70s). He attributes the elevated readings at the doctor's office to anxiety  Cough This is a new problem. The current episode started more than 1 month ago. The problem has been gradually improving. The problem occurs every few hours. The cough is Non-productive. Pertinent negatives include no chest pain, chills, ear congestion, ear pain, fever, headaches, heartburn, hemoptysis, myalgias, nasal congestion, postnasal drip, rash, rhinorrhea, sore throat, shortness of breath, sweats, weight loss or wheezing. The symptoms are aggravated by cold air. He has tried steroid inhaler and a beta-agonist inhaler for the symptoms. The treatment provided mild relief. His past medical history is significant for asthma.  Anxiety Presents for follow-up visit. Symptoms include excessive worry and irritability. Patient reports no chest pain or shortness of breath. Symptoms occur occasionally. The severity of symptoms is moderate. The quality of sleep is fair. Nighttime awakenings: occasional.   His past medical history is significant for asthma. Compliance with medications is 76-100%.    Cough  Anxiety     ROS: Per HPI  Current Outpatient Medications:    albuterol  (VENTOLIN  HFA) 108 (90 Base) MCG/ACT inhaler, Inhale 2 puffs into the lungs every 6 (six) hours as needed for wheezing or shortness of breath., Disp: 8 g, Rfl: 0  benzonatate  (TESSALON ) 100 MG capsule, Take 1 capsule (100 mg total) by mouth 2 (two) times daily as needed for cough., Disp: 20 capsule, Rfl: 0   FLUoxetine  (PROZAC ) 40 MG capsule, Take 1 capsule (40 mg total) by  mouth daily., Disp: 30 capsule, Rfl: 1   fluticasone  furoate-vilanterol (BREO ELLIPTA ) 200-25 MCG/ACT AEPB, Inhale 1 puff into the lungs daily., Disp: 1 each, Rfl: 4   gabapentin  (NEURONTIN ) 300 MG capsule, TAKE 1 CAPSULE BY MOUTH 3 TIMES  DAILY, Disp: 270 capsule, Rfl: 3   LORazepam  (ATIVAN ) 0.5 MG tablet, Take 1 tablet (0.5 mg total) by mouth 2 (two) times daily as needed for anxiety., Disp: 60 tablet, Rfl: 0   methocarbamol  (ROBAXIN ) 500 MG tablet, Take 1 tablet (500 mg total) by mouth every 8 (eight) hours as needed for muscle spasms., Disp: 90 tablet, Rfl: 0   pantoprazole  (PROTONIX ) 40 MG tablet, TAKE 1 TABLET BY MOUTH DAILY, Disp: 90 tablet, Rfl: 3   rosuvastatin  (CRESTOR ) 10 MG tablet, Take 1 tablet (10 mg total) by mouth daily., Disp: 90 each, Rfl: 3   tirzepatide  (ZEPBOUND ) 2.5 MG/0.5ML Pen, Inject 2.5 mg into the skin once a week., Disp: 2 mL, Rfl: 0   Vitamin D , Ergocalciferol , (DRISDOL ) 1.25 MG (50000 UNIT) CAPS capsule, Take 1 capsule (50,000 Units total) by mouth every 7 (seven) days., Disp: 12.857 each, Rfl: 1  Observations/Objective: There were no vitals filed for this visit. Physical Exam Constitutional:      General: He is not in acute distress.    Appearance: Normal appearance. He is not ill-appearing, toxic-appearing or diaphoretic.  HENT:     Head: Normocephalic.  Eyes:     Pupils: Pupils are equal, round, and reactive to light.  Pulmonary:     Effort: Pulmonary effort is normal. No respiratory distress.     Breath sounds: No wheezing.     Comments: Intermittent coughing during visit Neurological:     General: No focal deficit present.     Mental Status: He is alert and oriented to person, place, and time. Mental status is at baseline.  Psychiatric:        Attention and Perception: Attention and perception normal. He is attentive.        Mood and Affect: Affect normal. Mood is anxious. Mood is not depressed. Affect is not flat or tearful.        Speech: Speech  normal.        Behavior: Behavior normal. Behavior is cooperative.        Thought Content: Thought content normal.        Cognition and Memory: Cognition and memory normal.        Judgment: Judgment normal.     Assessment and Plan: GAD (generalized anxiety disorder) Assessment & Plan: Improvement noted on Prozac  20mg  daily, but still experiencing some irritability and anxiety, particularly in the evenings. -Increase Prozac  to 40mg  daily. -Reassess in 4-6 weeks.  Orders: -     FLUoxetine  HCl; Take 1 capsule (40 mg total) by mouth daily.  Dispense: 30 capsule; Refill: 1  Subacute cough Assessment & Plan: Appears to be post viral cough lingering -Tessalon  100 mg BID prn -Added as needed albuterol   Continue hot tea with honey, cough drops, humidification and increase daily water intake  Orders: -     Benzonatate ; Take 1 capsule (100 mg total) by mouth 2 (two) times daily as needed for cough.  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  Dispense: 8 g; Refill: 0  Mixed hyperlipidemia Assessment & Plan: Chronic Started on Crestr 10mg  in [place] 2024 Will recheck lipid panel fasting in 6 week f/u Continue low saturate fat and processed food diet Increase daily exercise.    Follow Up Instructions: Return in about 6 weeks (around 09/03/2023) for mood and labs.   I discussed the assessment and treatment plan with the patient. The patient was provided an opportunity to ask questions, and all were answered. The patient agreed with the plan and demonstrated an understanding of the instructions.   The patient was advised to call back or seek an in-person evaluation if the symptoms  worsen or if the condition fails to improve as anticipated.  The above assessment and management plan was discussed with the patient. The patient verbalized understanding of and has agreed to the management plan.   I, Curtis DELENA Boom, FNP, have reviewed all documentation for this visit. The documentation on 07/23/23 for the exam, diagnosis, procedures, and orders are all accurate and complete.   Curtis DELENA Boom, FNP

## 2023-07-23 NOTE — Assessment & Plan Note (Signed)
 Continue Daily Breo Ellipta for maintenance Added as needed albuterol for cough and wheezing

## 2023-08-09 ENCOUNTER — Other Ambulatory Visit: Payer: Self-pay | Admitting: *Deleted

## 2023-08-09 MED ORDER — VITAMIN D (ERGOCALCIFEROL) 1.25 MG (50000 UNIT) PO CAPS: 50000.0000 [IU] | ORAL_CAPSULE | ORAL | 1 refills | Status: DC

## 2023-08-21 ENCOUNTER — Other Ambulatory Visit: Payer: Self-pay | Admitting: Family Medicine

## 2023-08-21 ENCOUNTER — Telehealth: Payer: Self-pay | Admitting: Family Medicine

## 2023-08-21 DIAGNOSIS — J4531 Mild persistent asthma with (acute) exacerbation: Secondary | ICD-10-CM

## 2023-08-21 DIAGNOSIS — J4540 Moderate persistent asthma, uncomplicated: Secondary | ICD-10-CM

## 2023-08-21 MED ORDER — FLUTICASONE FUROATE-VILANTEROL 200-25 MCG/ACT IN AEPB: 1.0000 | INHALATION_SPRAY | Freq: Every day | RESPIRATORY_TRACT | 4 refills | Status: AC

## 2023-08-21 NOTE — Telephone Encounter (Signed)
[facility] is requesting refill fluticasone furoate-vilanterol (BREO ELLIPTA) 200-25 MCG/ACT AEPB  Please advise

## 2023-09-04 ENCOUNTER — Ambulatory Visit: Payer: 59 | Admitting: Family Medicine

## 2023-09-04 ENCOUNTER — Encounter: Payer: Self-pay | Admitting: Family Medicine

## 2023-09-04 VITALS — BP 131/77 | HR 73 | Ht 72.0 in | Wt 291.7 lb

## 2023-09-04 DIAGNOSIS — G2581 Restless legs syndrome: Secondary | ICD-10-CM

## 2023-09-04 DIAGNOSIS — I10 Essential (primary) hypertension: Secondary | ICD-10-CM

## 2023-09-04 DIAGNOSIS — F411 Generalized anxiety disorder: Secondary | ICD-10-CM | POA: Diagnosis not present

## 2023-09-04 DIAGNOSIS — E559 Vitamin D deficiency, unspecified: Secondary | ICD-10-CM

## 2023-09-04 DIAGNOSIS — L293 Anogenital pruritus, unspecified: Secondary | ICD-10-CM | POA: Insufficient documentation

## 2023-09-04 DIAGNOSIS — E782 Mixed hyperlipidemia: Secondary | ICD-10-CM

## 2023-09-04 DIAGNOSIS — M62838 Other muscle spasm: Secondary | ICD-10-CM

## 2023-09-04 MED ORDER — METHOCARBAMOL 500 MG PO TABS: 500.0000 mg | ORAL_TABLET | Freq: Three times a day (TID) | ORAL | 0 refills | Status: DC | PRN

## 2023-09-04 MED ORDER — VITAMIN D (ERGOCALCIFEROL) 1.25 MG (50000 UNIT) PO CAPS: 50000.0000 [IU] | ORAL_CAPSULE | ORAL | 0 refills | Status: DC

## 2023-09-04 MED ORDER — FLUOXETINE HCL 40 MG PO CAPS: 40.0000 mg | ORAL_CAPSULE | Freq: Every day | ORAL | 3 refills | Status: DC

## 2023-09-04 NOTE — Progress Notes (Addendum)
 Established Patient Office Visit  Introduced to nurse practitioner role and practice setting.  All questions answered.  Discussed provider/patient relationship and expectations.   Subjective   Patient ID: Francisco Oneal, male    DOB: [birth year]  Age: 53 y.o. MRN: [id number]  Chief Complaint  Patient presents with   Medical Management of Chronic Issues    Follow up    The patient presents for a general follow-up visit on chronic disease mgmt.  He is currently taking fluoxetine for anxiety, which was recently increased to 40 mg in January. The medication helps with anxiety but causes fatigue and tiredness. He has gained approximately eight pounds since in person visit in November 2024.   He is on Crestor 10 mg for cholesterol control, with previous total cholesterol of 236 mg/dL and LDL of 409 mg/dL. He has been on the medication for about three months and is due for a recheck of his lipid levels.  He uses albuterol and Breo Ellipta daily for asthma, with no recent acute flares. He experiences increased symptoms when he has a cold but otherwise manages well. He occasionally uses gabapentin for restless legs, taking up to 300 mg as needed, and lorazepam at night for sleep issues. He also uses methocarbamol as needed for restless legs, though he is running low on this medication.  HTN - controlled through lifestyle.   GERD - daily pantoprozole.  States urine odor off and have had some increased in itchiness in gone, he wonders if because his wife works in healthcare, she has brought something home with her.   Intermittent R sided abdominal pulling sensation - was worked up last year, everything unremarkable per pt.         09/04/2023   10:36 AM 05/22/2023    8:56 AM 05/17/2022    8:46 AM  Depression screen PHQ 2/9  Decreased Interest 0 0 0  Down, Depressed, Hopeless 0 0 0  PHQ - 2 Score 0 0 0  Altered sleeping 0 0 0  Tired, decreased energy 1 1 0  Change in appetite 0 0 0   Feeling bad or failure about yourself  0 0 0  Trouble concentrating 0 0 0  Moving slowly or fidgety/restless 0 0 0  Suicidal thoughts 0 0 0  PHQ-9 Score 1 1 0  Difficult doing work/chores  Somewhat difficult Not difficult at all       09/04/2023   10:36 AM 05/22/2023    8:56 AM  GAD 7 : Generalized Anxiety Score  Nervous, Anxious, on Edge 1 1  Control/stop worrying 1 1  Worry too much - different things 1 1  Trouble relaxing 0 1  Restless 0 0  Easily annoyed or irritable 0 1  Afraid - awful might happen 0 1  Total GAD 7 Score 3 6  Anxiety Difficulty  Somewhat difficult     Review of Systems  All other systems reviewed and are negative.   Negative unless indicated in HPI   Objective:     BP 131/77   Pulse 73   Ht 6' (1.829 m)   Wt 291 lb 11.2 oz (132.3 kg)   SpO2 97%   BMI 39.56 kg/m    Physical Exam Constitutional:      General: He is not in acute distress.    Appearance: Normal appearance. He is not ill-appearing, toxic-appearing or diaphoretic.  HENT:     Head: Normocephalic.     Nose: Nose normal.     Mouth/Throat:  Mouth: Mucous membranes are moist.     Pharynx: No oropharyngeal exudate or posterior oropharyngeal erythema.  Eyes:     Extraocular Movements: Extraocular movements intact.     Conjunctiva/sclera: Conjunctivae normal.     Pupils: Pupils are equal, round, and reactive to light.  Neck:     Vascular: No carotid bruit.  Cardiovascular:     Rate and Rhythm: Normal rate and regular rhythm.     Pulses: Normal pulses.     Heart sounds: Normal heart sounds. No murmur heard.    No friction rub. No gallop.  Pulmonary:     Effort: Pulmonary effort is normal. No respiratory distress.     Breath sounds: Normal breath sounds. No stridor. No wheezing, rhonchi or rales.  Chest:     Chest wall: No tenderness.  Abdominal:     Tenderness: There is no right CVA tenderness or left CVA tenderness.  Musculoskeletal:     Right lower leg: No edema.      Left lower leg: No edema.  Lymphadenopathy:     Cervical: No cervical adenopathy.  Skin:    General: Skin is warm and dry.     Capillary Refill: Capillary refill takes less than 2 seconds.  Neurological:     General: No focal deficit present.     Mental Status: He is alert and oriented to person, place, and time. Mental status is at baseline.     Cranial Nerves: No cranial nerve deficit.     Sensory: No sensory deficit.     Motor: No weakness.     Coordination: Coordination normal.     Gait: Gait normal.  Psychiatric:        Mood and Affect: Mood normal.        Behavior: Behavior normal.        Thought Content: Thought content normal.        Judgment: Judgment normal.     Comments: Anxiety in regard to health    No results found for any visits on 09/04/23.    The 10-year ASCVD risk score (Arnett DK, et al., 2019) is: 5.8%    Assessment & Plan:  GAD (generalized anxiety disorder) Assessment & Plan:  Improved with increased dose of Fluoxetine to 40mg , but experiencing fatigue and weight gain (+8lbs) as side effects. -Continue Fluoxetine 40mg , but take at night to mitigate fatigue. -Consider dose reduction in 6 months if patient is stable. -Discussed maintain active lifestyle, and SSRI's can cause a mild increase in weight -Healthy, well balanced meals  Orders: -     FLUoxetine HCl; Take 1 capsule (40 mg total) by mouth daily.  Dispense: 90 capsule; Refill: 3  Primary hypertension Assessment & Plan: Borderline stage 1 to elevated BP Goal<120/80 for normo tension Monitor at home with upper arm cuff automatic Continue to make lifestyle changes - low sodium diet, weight loss Will hold off on BP medication for now Check CMP today  Orders: -     Comprehensive metabolic panel  Morbid obesity (HCC) Assessment & Plan: Recommend referral to Dietician and Nutritionist - pt will think about it Continue to make conscious decisions for well balanced diet smaller portions with  increase protein, fruits, veggies, water as drink of choice, decrease starches, processed foods, and saturated fats.  Increase weekly exercise - 150 minutes per week.  Pt plans to call insurance to see what weight loss medication is covered  Orders: -     Lipid panel  Mixed hyperlipidemia Assessment & Plan: -  Check lipid levels today. -Continue Crestor 10mg  daily Goal total cholesterol<200, LDL<100   RLS (restless legs syndrome) Assessment & Plan: Continue as needed gabapentin 300mg  and Robaxin 500mg   Pt needs refill on robaxin Otherwise feels well managed   Muscle spasm Assessment & Plan: Muscle spasm c/b restless leg Robaxin  500mg  as needed q8 for assistance  Orders: -     Methocarbamol; Take 1 tablet (500 mg total) by mouth every 8 (eight) hours as needed for muscle spasms.  Dispense: 90 tablet; Refill: 0  Avitaminosis D Assessment & Plan: Continue to take weekly supplement Refilled for pt.  Plan to recheck vitamin D levels in 6 months.   Orders: -     Vitamin D (Ergocalciferol); Take 1 capsule (50,000 Units total) by mouth every 7 (seven) days.  Dispense: 16 capsule; Refill: 0  Itching of male genitalia Assessment & Plan: Pt has intermittent concerns for groin itching States no skin lesions, erythema, breakdown - or obvious reason for itching Declined physical assessment of groin Pt concerns for wife (works in healthcare) being exposed to her patient who itches their groin Discussed the limited likelihood of something getting passed from a patient to wife to him is quite low. Pt agreeable, states could be in his head knowing what wife is exposed to, causing him to psychologically itch Hand hygiene and general hygiene discussed Denies dysuria, discharge, discoloration in urine - intermittent odor change, or no bumps on perineum or penis Discussed perineal hygiene, keeping area dry, limit moisture- offered nystatin powder - pt declined Offer UA with reflex - pt  declined does not think he has UTI Offered STI screening - pt declined Pt will reach out to provider if symptoms persist.      Return in about 6 months (around 03/03/2024) for chronic disease mgmt.   I, Sallee Provencal, FNP, have reviewed all documentation for this visit. The documentation on 09/04/23 for the exam, diagnosis, procedures, and orders are all accurate and complete.   Sallee Provencal, FNP

## 2023-09-04 NOTE — Assessment & Plan Note (Signed)
 Recommend referral to Dietician and Nutritionist - pt will think about it Continue to make conscious decisions for well balanced diet smaller portions with increase protein, fruits, veggies, water as drink of choice, decrease starches, processed foods, and saturated fats.  Increase weekly exercise - 150 minutes per week.  Pt plans to call insurance to see what weight loss medication is covered

## 2023-09-04 NOTE — Assessment & Plan Note (Signed)
 Muscle spasm c/b restless leg Robaxin  500mg  as needed q8 for assistance

## 2023-09-04 NOTE — Assessment & Plan Note (Signed)
 Continue as needed gabapentin 300mg  and Robaxin 500mg   Pt needs refill on robaxin Otherwise feels well managed

## 2023-09-04 NOTE — Assessment & Plan Note (Signed)
 Continue to take weekly supplement Refilled for pt.  Plan to recheck vitamin D levels in 6 months.

## 2023-09-04 NOTE — Assessment & Plan Note (Signed)
-  Check lipid levels today. -Continue Crestor 10mg  daily Goal total cholesterol<200, LDL<100

## 2023-09-04 NOTE — Assessment & Plan Note (Signed)
 Pt has intermittent concerns for groin itching States no skin lesions, erythema, breakdown - or obvious reason for itching Declined physical assessment of groin Pt concerns for wife (works in healthcare) being exposed to her patient who itches their groin Discussed the limited likelihood of something getting passed from a patient to wife to him is quite low. Pt agreeable, states could be in his head knowing what wife is exposed to, causing him to psychologically itch Hand hygiene and general hygiene discussed Denies dysuria, discharge, discoloration in urine - intermittent odor change, or no bumps on perineum or penis Discussed perineal hygiene, keeping area dry, limit moisture- offered nystatin powder - pt declined Offer UA with reflex - pt declined does not think he has UTI Offered STI screening - pt declined Pt will reach out to provider if symptoms persist.

## 2023-09-04 NOTE — Assessment & Plan Note (Addendum)
 Borderline stage 1 to elevated BP Goal<120/80 for normo tension Monitor at home with upper arm cuff automatic Continue to make lifestyle changes - low sodium diet, weight loss Will hold off on BP medication for now Check CMP today

## 2023-09-04 NOTE — Assessment & Plan Note (Signed)
 Improved with increased dose of Fluoxetine to 40mg , but experiencing fatigue and weight gain (+8lbs) as side effects. -Continue Fluoxetine 40mg , but take at night to mitigate fatigue. -Consider dose reduction in 6 months if patient is stable. -Discussed maintain active lifestyle, and SSRI's can cause a mild increase in weight -Healthy, well balanced meals

## 2023-09-05 ENCOUNTER — Encounter: Payer: Self-pay | Admitting: Family Medicine

## 2023-09-05 DIAGNOSIS — F411 Generalized anxiety disorder: Secondary | ICD-10-CM

## 2023-09-05 LAB — COMPREHENSIVE METABOLIC PANEL
ALT: 19 [IU]/L (ref 0–44)
AST: 17 [IU]/L (ref 0–40)
Albumin: 4.7 g/dL (ref 3.8–4.9)
Alkaline Phosphatase: 53 [IU]/L (ref 44–121)
BUN/Creatinine Ratio: 23 — ABNORMAL HIGH (ref 9–20)
BUN: 26 mg/dL — ABNORMAL HIGH (ref 6–24)
Bilirubin Total: 0.4 mg/dL (ref 0.0–1.2)
CO2: 21 mmol/L (ref 20–29)
Calcium: 9.4 mg/dL (ref 8.7–10.2)
Chloride: 102 mmol/L (ref 96–106)
Creatinine, Ser: 1.11 mg/dL (ref 0.76–1.27)
Globulin, Total: 2.5 g/dL (ref 1.5–4.5)
Glucose: 104 mg/dL — ABNORMAL HIGH (ref 70–99)
Potassium: 4.5 mmol/L (ref 3.5–5.2)
Sodium: 139 mmol/L (ref 134–144)
Total Protein: 7.2 g/dL (ref 6.0–8.5)
eGFR: 79 mL/min/{1.73_m2} (ref >59)

## 2023-09-05 LAB — LIPID PANEL
Chol/HDL Ratio: 2.8 {ratio} (ref 0.0–5.0)
Cholesterol, Total: 152 mg/dL (ref 100–199)
HDL: 55 mg/dL (ref >39)
LDL Chol Calc (NIH): 80 mg/dL (ref 0–99)
Triglycerides: 88 mg/dL (ref 0–149)
VLDL Cholesterol Cal: 17 mg/dL (ref 5–40)

## 2023-09-06 ENCOUNTER — Telehealth: Payer: Self-pay | Admitting: Family Medicine

## 2023-09-06 NOTE — Telephone Encounter (Signed)
 [facility] request for the following medications:   rosuvastatin (CRESTOR) 10 MG tablet     Please advise.

## 2023-09-06 NOTE — Telephone Encounter (Signed)
 Prescriptions were sent to : [facility] - [place], [place] - [address], [place] [address]   On 05/31/23 by previous PCP with 90 day supply and 3 refills. Order class normal.

## 2023-09-09 ENCOUNTER — Other Ambulatory Visit: Payer: Self-pay

## 2023-09-09 MED ORDER — ROSUVASTATIN CALCIUM 10 MG PO TABS: 10.0000 mg | ORAL_TABLET | Freq: Every day | ORAL | 3 refills | Status: DC

## 2023-10-06 ENCOUNTER — Other Ambulatory Visit: Payer: Self-pay | Admitting: Family Medicine

## 2023-10-06 DIAGNOSIS — M62838 Other muscle spasm: Secondary | ICD-10-CM

## 2023-10-09 MED ORDER — LORAZEPAM 0.5 MG PO TABS: 0.5000 mg | ORAL_TABLET | Freq: Two times a day (BID) | ORAL | 0 refills | Status: DC | PRN

## 2023-11-13 ENCOUNTER — Other Ambulatory Visit: Payer: Self-pay | Admitting: Family Medicine

## 2023-11-13 DIAGNOSIS — M62838 Other muscle spasm: Secondary | ICD-10-CM

## 2024-01-01 ENCOUNTER — Other Ambulatory Visit: Payer: Self-pay | Admitting: Family Medicine

## 2024-01-01 DIAGNOSIS — F411 Generalized anxiety disorder: Secondary | ICD-10-CM

## 2024-01-26 ENCOUNTER — Other Ambulatory Visit: Payer: Self-pay | Admitting: Family Medicine

## 2024-01-26 DIAGNOSIS — M62838 Other muscle spasm: Secondary | ICD-10-CM

## 2024-01-27 NOTE — Telephone Encounter (Signed)
 LOV 09/04/23 NOV 15/9/25 LABS 09/04/23

## 2024-03-25 ENCOUNTER — Other Ambulatory Visit: Payer: Self-pay | Admitting: Family Medicine

## 2024-03-25 DIAGNOSIS — M62838 Other muscle spasm: Secondary | ICD-10-CM

## 2024-04-20 ENCOUNTER — Other Ambulatory Visit: Payer: Self-pay

## 2024-04-20 ENCOUNTER — Telehealth: Payer: Self-pay | Admitting: Family Medicine

## 2024-04-20 MED ORDER — GABAPENTIN 300 MG PO CAPS: 300.0000 mg | ORAL_CAPSULE | Freq: Three times a day (TID) | ORAL | 3 refills | Status: DC

## 2024-04-20 NOTE — Telephone Encounter (Signed)
 Converted and sent to pcp

## 2024-04-20 NOTE — Telephone Encounter (Signed)
 LOV 09/04/23 NOV 05/17/24 LRF 06/11/23 270 x 3  Looks like early request

## 2024-04-20 NOTE — Telephone Encounter (Signed)
[place] faxed refill request for the following medications:   gabapentin (NEURONTIN) 300 MG capsule    Please advise.

## 2024-04-30 ENCOUNTER — Other Ambulatory Visit: Payer: Self-pay | Admitting: Family Medicine

## 2024-04-30 DIAGNOSIS — F411 Generalized anxiety disorder: Secondary | ICD-10-CM

## 2024-04-30 DIAGNOSIS — M62838 Other muscle spasm: Secondary | ICD-10-CM

## 2024-05-06 ENCOUNTER — Telehealth: Payer: Self-pay | Admitting: Family Medicine

## 2024-05-06 MED ORDER — GABAPENTIN 300 MG PO CAPS: 300.0000 mg | ORAL_CAPSULE | Freq: Three times a day (TID) | ORAL | 3 refills | Status: AC

## 2024-05-06 NOTE — Telephone Encounter (Signed)
[place] faxed refill request for the following medications:   gabapentin (NEURONTIN) 300 MG capsule    Please advise.

## 2024-05-06 NOTE — Telephone Encounter (Signed)
 Pt medication was sent in 04/20/24 with refills. Duplicate order.

## 2024-05-06 NOTE — Telephone Encounter (Signed)
 Pt reports rx should always go to [facility] unless it is acute concern. Made aware we [facility]. He verbalized understanding and reports he will go and [facility] if he can if not he will contact the pharmacy

## 2024-05-06 NOTE — Telephone Encounter (Signed)
 [facility] is requesting this medication again.  [facility] and was also requested from [facility] at that time.  Please send to correct pharmacy.

## 2024-05-23 ENCOUNTER — Other Ambulatory Visit: Payer: Self-pay | Admitting: Family Medicine

## 2024-05-23 DIAGNOSIS — K219 Gastro-esophageal reflux disease without esophagitis: Secondary | ICD-10-CM

## 2024-05-27 ENCOUNTER — Encounter: Payer: Self-pay | Admitting: Family Medicine

## 2024-05-27 ENCOUNTER — Ambulatory Visit: Payer: 59 | Admitting: Family Medicine

## 2024-05-27 VITALS — BP 139/90 | HR 84 | Ht 72.0 in | Wt 315.7 lb

## 2024-05-27 DIAGNOSIS — R0789 Other chest pain: Secondary | ICD-10-CM | POA: Diagnosis not present

## 2024-05-27 DIAGNOSIS — Z125 Encounter for screening for malignant neoplasm of prostate: Secondary | ICD-10-CM

## 2024-05-27 DIAGNOSIS — Z23 Encounter for immunization: Secondary | ICD-10-CM

## 2024-05-27 DIAGNOSIS — F411 Generalized anxiety disorder: Secondary | ICD-10-CM | POA: Diagnosis not present

## 2024-05-27 DIAGNOSIS — Z0000 Encounter for general adult medical examination without abnormal findings: Secondary | ICD-10-CM

## 2024-05-27 DIAGNOSIS — M62838 Other muscle spasm: Secondary | ICD-10-CM | POA: Diagnosis not present

## 2024-05-27 DIAGNOSIS — Z0001 Encounter for general adult medical examination with abnormal findings: Secondary | ICD-10-CM

## 2024-05-27 DIAGNOSIS — K219 Gastro-esophageal reflux disease without esophagitis: Secondary | ICD-10-CM

## 2024-05-27 DIAGNOSIS — E782 Mixed hyperlipidemia: Secondary | ICD-10-CM | POA: Diagnosis not present

## 2024-05-27 DIAGNOSIS — I10 Essential (primary) hypertension: Secondary | ICD-10-CM | POA: Diagnosis not present

## 2024-05-27 DIAGNOSIS — E559 Vitamin D deficiency, unspecified: Secondary | ICD-10-CM

## 2024-05-27 NOTE — Assessment & Plan Note (Signed)
 Continue to make conscious decisions for well balanced diet smaller portions with increase protein, fruits, veggies, water as drink of choice, decrease starches, processed foods, and saturated fats. Increase weekly exercise - 150 minutes per week.   Declines previous referral to nutrition  States tried getting on Zepbound , but he was decline even thought his wife was approved. No interest in weight medications today

## 2024-05-27 NOTE — Assessment & Plan Note (Signed)
 Chronic, stable Continues on protonix  40 mg daily - if chest tightness continues and related to GERD/food intake may need further eval by GI

## 2024-05-27 NOTE — Assessment & Plan Note (Signed)
 Routine adult wellness visit. - Concerns for intermittent chest tightness - EKG - NSR - reassuring - will check CMP, CBC, TSH - Completed physical examination - Filled out physical form for patient's job  Things to do to keep yourself healthy  - Exercise at least 30-45 minutes a day, 3-4 days a week.  - Eat a low-fat diet with lots of fruits and vegetables, up to 7-9 servings per day.  - Seatbelts can save your life. Wear them always.  - Smoke detectors on every level of your home, check batteries every year.  - Eye Doctor - have an eye exam every 1-2 years  - Safe sex - if you may be exposed to STDs, use a condom.  - Alcohol -  If you drink, do it moderately, less than 2 drinks per day.  - Health Care Power of Attorney. Choose someone to speak for you if you are not able.  - Depression is common in our stressful world.If you're feeling down or losing interest in things you normally enjoy, please come in for a visit.  - Violence - If anyone is threatening or hurting you, please call immediately.

## 2024-05-27 NOTE — Assessment & Plan Note (Signed)
 Muscle spasm c/b restless leg Robaxin  500mg  as needed q8 for assistance

## 2024-05-27 NOTE — Assessment & Plan Note (Signed)
 Chronic, Elevated today - Anxiety symptoms exacerbated by recent stressors including the death of a pet and family history of cardiac issues.  - Continue fluoxetine  40mg  daily - Currently managed with Ativan  0.5mg  BID as needed, approximately three times a week.  - PMDP reviewed - increases stress could be cause of intermittent chest tightness - other prn options discussed and long term risk of benzos discussed, patient aware and would like to continue prn ativan .

## 2024-05-27 NOTE — Assessment & Plan Note (Signed)
 Repeat levels today Continue supplement if needed

## 2024-05-27 NOTE — Progress Notes (Signed)
 Complete physical exam  Patient: Francisco Oneal   DOB: 12-14-1970   53 y.o. Male  MRN: [id number]  Subjective:    Chief Complaint  Patient presents with   Annual Exam    Last completed 05/22/23 Diet - Average diet   Exercise - walking  Feeling - poorly compared to usual Sleeping - fairly well depending on the day Concerns -  Tightness in chest and SOB    [name] is a 53 y.o. male who presents today for a complete physical exam. He reports consuming a general diet. Does walking throughout week He generally feels poorly. He reports sleeping fairly well. He does have additional problems to discuss today- anxiety and chest discomfort.  Discussed the use of AI scribe software for clinical note transcription with the patient, who gave verbal consent to proceed.  History of Present Illness  He experiences a sensation of heaviness in the center of his chest, described as a persistent pulling, occurring during walking. It is not painful but rather a constant heaviness. He has a family history of cardiac issues, with his father having undergone bypass surgery three to four years ago after similar symptoms.  He experiences occasional palpitations, described as a 'flutter,' similar to indigestion. No numbness or tingling is present. He feels anxious, especially in medical settings, and reports that previous clinicians have described this as 'white coat syndrome.'  He takes Ativan  as needed for anxiety, approximately three times a week, mainly in the evenings to relax after work. He also uses gabapentin  occasionally for restless legs, which he believes helps with anxiety.  He works from home, spending most of his time in front of a computer, which he feels contributes to his anxiety and lack of social interaction. His job requires constant availability, adding to his stress.  He has a history of elevated blood pressure readings in clinical settings, attributed to 'white coat syndrome,' but  reports historical normal readings at home over a 30-day period. He has not checked his blood pressure recently.  Most recent fall risk assessment:    05/27/2024   10:05 AM  Fall Risk   Falls in the past year? 0  Number falls in past yr: 0  Injury with Fall? 0  Risk for fall due to : No Fall Risks  Follow up Falls evaluation completed     Most recent depression screenings:    05/27/2024   10:05 AM 09/04/2023   10:36 AM  PHQ 2/9 Scores  PHQ - 2 Score 0 0  PHQ- 9 Score 3 1      Data saved with a previous flowsheet row definition    Vision:Within last year and Dental: No current dental problems and Receives regular dental care  Patient Active Problem List   Diagnosis Date Noted   Muscle spasm 09/04/2023   Itching of male genitalia 09/04/2023   Reactive airway disease 07/23/2023   Mood disorder 05/26/2023   Avitaminosis D 05/26/2023   Mixed hyperlipidemia 05/22/2023   Gastroesophageal reflux disease without esophagitis 05/22/2023   Morbid obesity (HCC) 05/22/2023   Primary hypertension 05/22/2023   RLS (restless legs syndrome) 05/22/2023   Annual physical exam 05/17/2022   GAD (generalized anxiety disorder) 12/07/2015   Past Medical History:  Diagnosis Date   Allergy 1973   Anxiety 2017   GERD (gastroesophageal reflux disease) 2018   Past Surgical History:  Procedure Laterality Date   APPENDECTOMY     INCISION AND DRAINAGE OF WOUND Left 12/08/2014   Procedure: IRRIGATION  AND DEBRIDEMENT WOUND;  Surgeon: Norleen JINNY Maltos, MD;  Location: [facility];  Service: Orthopedics;  Laterality: Left;   OPEN REDUCTION INTERNAL FIXATION (ORIF) DISTAL RADIAL FRACTURE Left 12/08/2014   Procedure: OPEN REDUCTION INTERNAL FIXATION (ORIF) DISTAL RADIAL FRACTURE;  Surgeon: Norleen JINNY Maltos, MD;  Location: [facility];  Service: Orthopedics;  Laterality: Left;   Social History   Tobacco Use   Smoking status: Never    Passive exposure: Never   Smokeless tobacco: Never  Substance Use Topics    Alcohol use: Yes    Alcohol/week: 10.0 standard drinks of alcohol    Types: 10 Cans of beer per week   Drug use: Never   Allergies  Allergen Reactions   Bee Venom Anaphylaxis   Penicillins Other (See Comments)    Has patient had a PCN reaction causing immediate rash, facial/tongue/throat swelling, SOB or lightheadedness with hypotension: Yes Has patient had a PCN reaction causing severe rash involving mucus membranes or skin necrosis: Yes Has patient had a PCN reaction that required hospitalization: Yes Has patient had a PCN reaction occurring within the last 10 years: No If all of the above answers are NO, then may proceed with Cephalosporin use.       Patient Care Team: Wellington Curtis LABOR, FNP as PCP - General (Family Medicine)   Outpatient Medications Prior to Visit  Medication Sig   albuterol  (VENTOLIN  HFA) 108 (90 Base) MCG/ACT inhaler TAKE 2 PUFFS BY MOUTH EVERY 6 HOURS AS NEEDED FOR WHEEZE OR SHORTNESS OF BREATH   FLUoxetine  (PROZAC ) 40 MG capsule Take 1 capsule (40 mg total) by mouth daily.   fluticasone  furoate-vilanterol (BREO ELLIPTA ) 200-25 MCG/ACT AEPB Inhale 1 puff into the lungs daily.   gabapentin  (NEURONTIN ) 300 MG capsule Take 1 capsule (300 mg total) by mouth 3 (three) times daily.   LORazepam  (ATIVAN ) 0.5 MG tablet TAKE 1 TABLET BY MOUTH TWICE A DAY AS NEEDED FOR ANXIETY   methocarbamol  (ROBAXIN ) 500 MG tablet TAKE 1 TABLET BY MOUTH EVERY 8 HOURS AS NEEDED FOR MUSCLE SPASMS   pantoprazole  (PROTONIX ) 40 MG tablet TAKE 1 TABLET BY MOUTH DAILY   rosuvastatin  (CRESTOR ) 10 MG tablet Take 1 tablet (10 mg total) by mouth daily.   Vitamin D , Ergocalciferol , (DRISDOL ) 1.25 MG (50000 UNIT) CAPS capsule Take 1 capsule (50,000 Units total) by mouth every 7 (seven) days.   [DISCONTINUED] tirzepatide  (ZEPBOUND ) 2.5 MG/0.5ML Pen Inject 2.5 mg into the skin once a week.   No facility-administered medications prior to visit.    ROS        Objective:     BP (!) 139/90    Pulse 84   Ht 6' (1.829 m)   Wt (!) 315 lb 11.2 oz (143.2 kg)   BMI 42.82 kg/m  BP Readings from Last 3 Encounters:  05/27/24 (!) 139/90  09/04/23 131/77  05/22/23 126/87   Wt Readings from Last 3 Encounters:  05/27/24 (!) 315 lb 11.2 oz (143.2 kg)  09/04/23 291 lb 11.2 oz (132.3 kg)  05/22/23 283 lb (128.4 kg)      Physical Exam Constitutional:      General: He is not in acute distress.    Appearance: Normal appearance. He is obese. He is not ill-appearing, toxic-appearing or diaphoretic.  HENT:     Head: Normocephalic.     Right Ear: Tympanic membrane, ear canal and external ear normal.     Left Ear: Tympanic membrane, ear canal and external ear normal.     Nose: Nose  normal. No congestion or rhinorrhea.     Mouth/Throat:     Mouth: Mucous membranes are moist.     Pharynx: Oropharynx is clear. No oropharyngeal exudate or posterior oropharyngeal erythema.  Eyes:     General: Lids are normal.     Extraocular Movements: Extraocular movements intact.     Right eye: Normal extraocular motion.     Left eye: Normal extraocular motion.     Conjunctiva/sclera: Conjunctivae normal.     Right eye: Right conjunctiva is not injected.     Left eye: Left conjunctiva is not injected.     Pupils: Pupils are equal, round, and reactive to light.  Neck:     Thyroid: No thyroid mass, thyromegaly or thyroid tenderness.  Cardiovascular:     Rate and Rhythm: Normal rate and regular rhythm.     Pulses: Normal pulses.          Radial pulses are 2+ on the right side and 2+ on the left side.       Posterior tibial pulses are 2+ on the right side and 2+ on the left side.     Heart sounds: Normal heart sounds, S1 normal and S2 normal. No murmur heard.    No friction rub. No gallop.  Pulmonary:     Effort: Pulmonary effort is normal. No respiratory distress.     Breath sounds: Normal breath sounds. No stridor. No wheezing, rhonchi or rales.  Abdominal:     General: Bowel sounds are normal.  There is no distension.     Palpations: Abdomen is soft. There is no mass.     Tenderness: There is no abdominal tenderness. There is no right CVA tenderness, left CVA tenderness, guarding or rebound.     Hernia: No hernia is present.  Musculoskeletal:        General: No swelling or tenderness. Normal range of motion.     Cervical back: Normal range of motion. No rigidity.  Lymphadenopathy:     Cervical: No cervical adenopathy.     Right cervical: No superficial, deep or posterior cervical adenopathy.    Left cervical: No superficial, deep or posterior cervical adenopathy.  Skin:    General: Skin is warm and dry.     Capillary Refill: Capillary refill takes less than 2 seconds.     Findings: No bruising or erythema.  Neurological:     General: No focal deficit present.     Mental Status: He is alert and oriented to person, place, and time. Mental status is at baseline.     GCS: GCS eye subscore is 4. GCS verbal subscore is 5. GCS motor subscore is 6.     Cranial Nerves: No cranial nerve deficit.     Sensory: No sensory deficit.     Motor: No weakness, tremor or pronator drift.     Coordination: Romberg sign negative.     Gait: Gait is intact. Gait normal.  Psychiatric:        Attention and Perception: Attention and perception normal.        Mood and Affect: Affect normal. Mood is anxious.        Speech: Speech normal.        Behavior: Behavior normal. Behavior is cooperative.        Thought Content: Thought content normal.        Cognition and Memory: Cognition and memory normal.        Judgment: Judgment normal.     EKG: normal EKG,  normal sinus rhythm, unchanged from previous tracings.   No results found for any visits on 05/27/24.      Assessment & Plan:    Routine Health Maintenance and Physical Exam  Assessment and Plan Assessment & Plan   Immunization Due Discussion on immunizations including Pneumo, flu, and Tdap - Administer flu, Tdap, and pneumococcal  today - Administered flu vaccine  Chest Tightness Intermittent chest pain described as a pulling sensation in the center of the chest. Family history of cardiac issues. Differential includes cardiac causes, msk, GERD and anxiety-related symptoms - no chest pain during viist, no DOB, SOB - vitals stable - EKG- NSR - reassuring - Will check CMP, CBC, TSH - pt has hx of GERD and anxiety, pt said increased recent situational stress, may be contributing - Will monitor for now - recommend journaling when tightness occurs and associated symptoms - Will refer to cardiology if EKG is abnormal  Health Maintenance  Topic Date Due   Hepatitis B Vaccine (1 of 3 - 19+ 3-dose series) Never done   Zoster (Shingles) Vaccine (1 of 2) Never done   COVID-19 Vaccine (5 - 2025-26 season) 03/09/2024   Cologuard (Stool DNA test)  05/28/2026   DTaP/Tdap/Td vaccine (4 - Td or Tdap) 05/27/2034   Pneumococcal Vaccine for age over 49  Completed   Flu Shot  Completed   Hepatitis C Screening  Completed   HIV Screening  Completed   HPV Vaccine  Aged Out   Meningitis B Vaccine  Aged Out    Discussed health benefits of physical activity, and encouraged him to engage in regular exercise appropriate for his age and condition.  Immunization due -     Pneumococcal conjugate vaccine 20-valent -     Flu vaccine trivalent PF, 6mos and older(Flulaval,Afluria,Fluarix,Fluzone) -     Tdap vaccine greater than or equal to 7yo IM  Annual physical exam Assessment & Plan: Routine adult wellness visit. - Concerns for intermittent chest tightness - EKG - NSR - reassuring - will check CMP, CBC, TSH - Completed physical examination - Filled out physical form for patient's job  Things to do to keep yourself healthy  - Exercise at least 30-45 minutes a day, 3-4 days a week.  - Eat a low-fat diet with lots of fruits and vegetables, up to 7-9 servings per day.  - Seatbelts can save your life. Wear them always.  - Smoke  detectors on every level of your home, check batteries every year.  - Eye Doctor - have an eye exam every 1-2 years  - Safe sex - if you may be exposed to STDs, use a condom.  - Alcohol -  If you drink, do it moderately, less than 2 drinks per day.  - Health Care Power of Attorney. Choose someone to speak for you if you are not able.  - Depression is common in our stressful world.If you're feeling down or losing interest in things you normally enjoy, please come in for a visit.  - Violence - If anyone is threatening or hurting you, please call immediately.   Orders: -     Comprehensive metabolic panel with GFR -     CBC -     PSA  Chest tightness -     EKG 12-Lead  Primary hypertension Assessment & Plan: Blood pressure readings elevated in office but reportedly normal at home. Possible white coat syndrome - pt said that is what he was diagnosed with by previously PCP. Discussed importance  of home monitoring and maintaining a log. Advised to bring home blood pressure machine to office for comparison. - Monitor blood pressure at home and maintain a log - GOAL<120/80 - Bring home blood pressure machine to office for comparison - Follow up in 4-6 weeks with blood pressure log  Orders: -     Comprehensive metabolic panel with GFR  Mixed hyperlipidemia Assessment & Plan: Chronic, continue rosuvastatin  10mg  daily Check Lipid panel today  Orders: -     Lipid panel  Morbid obesity (HCC) Assessment & Plan: Continue to make conscious decisions for well balanced diet smaller portions with increase protein, fruits, veggies, water as drink of choice, decrease starches, processed foods, and saturated fats. Increase weekly exercise - 150 minutes per week.   Declines previous referral to nutrition  States tried getting on Zepbound , but he was decline even thought his wife was approved. No interest in weight medications today  Orders: -     Hemoglobin A1c -     TSH  Avitaminosis  D Assessment & Plan: Repeat levels today Continue supplement if needed  Orders: -     VITAMIN D  25 Hydroxy (Vit-D Deficiency, Fractures)  Screening PSA (prostate specific antigen) -     PSA  GAD (generalized anxiety disorder) Assessment & Plan: Chronic, Elevated today - Anxiety symptoms exacerbated by recent stressors including the death of a pet and family history of cardiac issues.  - Continue fluoxetine  40mg  daily - Currently managed with Ativan  0.5mg  BID as needed, approximately three times a week.  - PMDP reviewed - increases stress could be cause of intermittent chest tightness - other prn options discussed and long term risk of benzos discussed, patient aware and would like to continue prn ativan .   Muscle spasm Assessment & Plan: Muscle spasm c/b restless leg Robaxin   500mg  as needed q8 for assistance     Gastroesophageal reflux disease without esophagitis Assessment & Plan: Chronic, stable Continues on protonix  40 mg daily - if chest tightness continues and related to GERD/food intake may need further eval by GI      Return in about 2 months (around 07/27/2024) for Chronic Disease mgmt w/ new PCP.   I, Curtis DELENA Boom, FNP, have reviewed all documentation for this visit. The documentation on 05/27/24 for the exam, diagnosis, procedures, and orders are all accurate and complete.   Curtis DELENA Boom, FNP

## 2024-05-27 NOTE — Assessment & Plan Note (Addendum)
 Blood pressure readings elevated in office but reportedly normal at home. Possible white coat syndrome - pt said that is what he was diagnosed with by previously PCP. Discussed importance of home monitoring and maintaining a log. Advised to bring home blood pressure machine to office for comparison. - Monitor blood pressure at home and maintain a log - GOAL<120/80 - Bring home blood pressure machine to office for comparison - Follow up in 4-6 weeks with blood pressure log

## 2024-05-27 NOTE — Assessment & Plan Note (Signed)
 Chronic, continue rosuvastatin  10mg  daily Check Lipid panel today

## 2024-05-28 ENCOUNTER — Other Ambulatory Visit (HOSPITAL_COMMUNITY): Payer: Self-pay

## 2024-05-28 ENCOUNTER — Ambulatory Visit: Payer: Self-pay | Admitting: Family Medicine

## 2024-05-28 LAB — HEMOGLOBIN A1C
Est. average glucose Bld gHb Est-mCnc: 105 mg/dL
Hgb A1c MFr Bld: 5.3 % (ref 4.8–5.6)

## 2024-05-28 LAB — COMPREHENSIVE METABOLIC PANEL WITH GFR
ALT: 32 IU/L (ref 0–44)
AST: 27 IU/L (ref 0–40)
Albumin: 4.6 g/dL (ref 3.8–4.9)
Alkaline Phosphatase: 51 IU/L (ref 47–123)
BUN/Creatinine Ratio: 13 (ref 9–20)
BUN: 14 mg/dL (ref 6–24)
Bilirubin Total: 0.4 mg/dL (ref 0.0–1.2)
CO2: 24 mmol/L (ref 20–29)
Calcium: 9.8 mg/dL (ref 8.7–10.2)
Chloride: 101 mmol/L (ref 96–106)
Creatinine, Ser: 1.04 mg/dL (ref 0.76–1.27)
Globulin, Total: 2.6 g/dL (ref 1.5–4.5)
Glucose: 91 mg/dL (ref 70–99)
Potassium: 4.8 mmol/L (ref 3.5–5.2)
Sodium: 138 mmol/L (ref 134–144)
Total Protein: 7.2 g/dL (ref 6.0–8.5)
eGFR: 85 mL/min/1.73 (ref >59)

## 2024-05-28 LAB — LIPID PANEL
Chol/HDL Ratio: 3.4 ratio (ref 0.0–5.0)
Cholesterol, Total: 178 mg/dL (ref 100–199)
HDL: 53 mg/dL (ref >39)
LDL Chol Calc (NIH): 98 mg/dL (ref 0–99)
Triglycerides: 157 mg/dL — ABNORMAL HIGH (ref 0–149)
VLDL Cholesterol Cal: 27 mg/dL (ref 5–40)

## 2024-05-28 LAB — PSA: Prostate Specific Ag, Serum: 0.4 ng/mL (ref 0.0–4.0)

## 2024-05-28 LAB — CBC
Hematocrit: 46.1 % (ref 37.5–51.0)
Hemoglobin: 15.4 g/dL (ref 13.0–17.7)
MCH: 31.8 pg (ref 26.6–33.0)
MCHC: 33.4 g/dL (ref 31.5–35.7)
MCV: 95 fL (ref 79–97)
Platelets: 251 x10E3/uL (ref 150–450)
RBC: 4.85 x10E6/uL (ref 4.14–5.80)
RDW: 12.4 % (ref 11.6–15.4)
WBC: 9.5 x10E3/uL (ref 3.4–10.8)

## 2024-05-28 LAB — VITAMIN D 25 HYDROXY (VIT D DEFICIENCY, FRACTURES): Vit D, 25-Hydroxy: 23 ng/mL — ABNORMAL LOW (ref 30.0–100.0)

## 2024-05-28 LAB — TSH: TSH: 2.78 u[IU]/mL (ref 0.450–4.500)

## 2024-06-17 ENCOUNTER — Telehealth: Payer: Self-pay | Admitting: Family Medicine

## 2024-06-17 NOTE — Telephone Encounter (Signed)
 [facility] faxed refill request for the following medications:  methocarbamol  (ROBAXIN ) 500 MG tablet     Please advise.

## 2024-06-18 ENCOUNTER — Other Ambulatory Visit: Payer: Self-pay

## 2024-06-18 DIAGNOSIS — M62838 Other muscle spasm: Secondary | ICD-10-CM

## 2024-06-18 MED ORDER — METHOCARBAMOL 500 MG PO TABS: 500.0000 mg | ORAL_TABLET | Freq: Three times a day (TID) | ORAL | 0 refills | Status: AC | PRN

## 2024-06-18 NOTE — Telephone Encounter (Signed)
 LOV 05/27/24 NOV 07/30/24 (TOC) LRF 04/30/24 qty:90 r:0 methocarbamol  (ROBAXIN ) 500 MG

## 2024-06-29 ENCOUNTER — Telehealth: Payer: Self-pay | Admitting: Family Medicine

## 2024-06-29 NOTE — Telephone Encounter (Signed)
 Pt has requested refill to seen. Pt has enough til end of 09/2024

## 2024-06-29 NOTE — Telephone Encounter (Signed)
 [facility] request for the following medications:   rosuvastatin (CRESTOR) 10 MG tablet     Please advise.

## 2024-07-01 ENCOUNTER — Other Ambulatory Visit: Payer: Self-pay

## 2024-07-01 ENCOUNTER — Telehealth: Payer: Self-pay | Admitting: Family Medicine

## 2024-07-01 DIAGNOSIS — F411 Generalized anxiety disorder: Secondary | ICD-10-CM

## 2024-07-01 MED ORDER — LORAZEPAM 0.5 MG PO TABS: 0.5000 mg | ORAL_TABLET | Freq: Two times a day (BID) | ORAL | 0 refills | Status: DC | PRN

## 2024-07-01 NOTE — Telephone Encounter (Addendum)
 Refill Request   [facility]  Medication:  benzonatate  (TESSALON ) 100 MG capsule  LORazepam  (ATIVAN ) 0.5 MG tablet    Please send in refill request.

## 2024-07-01 NOTE — Telephone Encounter (Signed)
 LOV 05/27/24 NOV 07/30/24 LRF 05/01/24 qty:60 r:0 Pt will be establishing care with you

## 2024-07-01 NOTE — Telephone Encounter (Signed)
 Request for Ativan  will be sent to provider for review. Tessalon  will be declined as pt needs an appt.

## 2024-07-06 ENCOUNTER — Telehealth: Payer: Self-pay | Admitting: Family Medicine

## 2024-07-06 ENCOUNTER — Other Ambulatory Visit: Payer: Self-pay

## 2024-07-06 NOTE — Telephone Encounter (Signed)
 [facility] faxed refill request for the following medications:  Benzonatate  100mg  capsule    Please advise.

## 2024-07-06 NOTE — Telephone Encounter (Signed)
 Patient needs office visit.

## 2024-07-22 ENCOUNTER — Other Ambulatory Visit: Payer: Self-pay

## 2024-07-22 DIAGNOSIS — K219 Gastro-esophageal reflux disease without esophagitis: Secondary | ICD-10-CM

## 2024-07-22 NOTE — Telephone Encounter (Unsigned)
 Copied from CRM [id number]. Topic: Clinical - Medication Refill >> Jul 22, 2024 12:44 PM Winona R wrote: Medication: request for 90 days  rosuvastatin  (CRESTOR ) 10 MG tablet pantoprazole  (PROTONIX ) 40 MG tablet [[id number]]  Has the patient contacted their pharmacy? Yes, they didn't have the refills  (Agent: If no, request that the patient contact the pharmacy for the refill. If patient does not wish to contact the pharmacy document the reason why and proceed with request.) (Agent: If yes, when and what did the pharmacy advise?)  This is the patient's preferred pharmacy:  [id number] - [place], [place] - [address] [place] [address] Phone: [phone] Fax: [phone]  Is this the correct pharmacy for this prescription? Yes If no, delete pharmacy and type the correct one.   Has the prescription been filled recently? Yes  Is the patient out of the medication? No 6 days left  Has the patient been seen for an appointment in the last year OR does the patient have an upcoming appointment? Yes  Can we respond through MyChart? Yes  Agent: Please be advised that Rx refills may take up to 3 business days. We ask that you follow-up with your pharmacy.

## 2024-07-23 MED ORDER — PANTOPRAZOLE SODIUM 40 MG PO TBEC: 40.0000 mg | DELAYED_RELEASE_TABLET | Freq: Every day | ORAL | 3 refills | Status: AC

## 2024-07-23 MED ORDER — ROSUVASTATIN CALCIUM 10 MG PO TABS: 10.0000 mg | ORAL_TABLET | Freq: Every day | ORAL | 3 refills | Status: AC

## 2024-07-23 NOTE — Telephone Encounter (Signed)
 Requested medication (s) are due for refill today:   Requested medication (s) are on the active medication list: Yes  Last refill:    Future visit scheduled: Yes - TOC  Notes to clinic:  See request.    Requested Prescriptions  Pending Prescriptions Disp Refills   pantoprazole  (PROTONIX ) 40 MG tablet 90 tablet 3    Sig: Take 1 tablet (40 mg total) by mouth daily.     Gastroenterology: Proton Pump Inhibitors Passed - 07/23/2024 12:10 PM      Passed - Valid encounter within last 12 months    Recent Outpatient Visits           1 month ago Immunization due   [facility] [place], [place] A, FNP   10 months ago GAD (generalized anxiety disorder)   [facility] [place], Curtis A, FNP               rosuvastatin  (CRESTOR ) 10 MG tablet 90 tablet 3    Sig: Take 1 tablet (10 mg total) by mouth daily.     Cardiovascular:  Antilipid - Statins 2 Failed - 07/23/2024 12:10 PM      Failed - Lipid Panel in normal range within the last 12 months    Cholesterol, Total  Date Value Ref Range Status  05/27/2024 178 100 - 199 mg/dL Final   LDL Chol Calc (NIH)  Date Value Ref Range Status  05/27/2024 98 0 - 99 mg/dL Final   HDL  Date Value Ref Range Status  05/27/2024 53 >39 mg/dL Final   Triglycerides  Date Value Ref Range Status  05/27/2024 157 (H) 0 - 149 mg/dL Final         Passed - Cr in normal range and within 360 days    Creatinine  Date Value Ref Range Status  06/07/2013 0.97 0.60 - 1.30 mg/dL Final   Creatinine, Ser  Date Value Ref Range Status  05/27/2024 1.04 0.76 - 1.27 mg/dL Final         Passed - Patient is not pregnant      Passed - Valid encounter within last 12 months    Recent Outpatient Visits           1 month ago Immunization due   [facility] [place], Curtis LABOR, FNP   10 months ago GAD (generalized anxiety disorder)   [place] [facility] [place],  Curtis LABOR, [place]

## 2024-07-29 ENCOUNTER — Ambulatory Visit: Admitting: Family Medicine

## 2024-07-30 ENCOUNTER — Ambulatory Visit

## 2024-07-30 VITALS — BP 136/96 | HR 94 | Temp 97.7°F | Wt 317.1 lb

## 2024-07-30 DIAGNOSIS — J4541 Moderate persistent asthma with (acute) exacerbation: Secondary | ICD-10-CM

## 2024-07-30 DIAGNOSIS — I10 Essential (primary) hypertension: Secondary | ICD-10-CM | POA: Diagnosis not present

## 2024-07-30 DIAGNOSIS — F411 Generalized anxiety disorder: Secondary | ICD-10-CM

## 2024-07-30 MED ORDER — PREDNISONE 20 MG PO TABS: 40.0000 mg | ORAL_TABLET | Freq: Every day | ORAL | 0 refills | Status: AC

## 2024-07-30 MED ORDER — PROAIR RESPICLICK 108 (90 BASE) MCG/ACT IN AEPB: 1.0000 | INHALATION_SPRAY | Freq: Four times a day (QID) | RESPIRATORY_TRACT | 1 refills | Status: AC | PRN

## 2024-07-30 MED ORDER — FLUOXETINE HCL 20 MG PO CAPS: 60.0000 mg | ORAL_CAPSULE | Freq: Every day | ORAL | 3 refills | Status: AC

## 2024-07-30 NOTE — Progress Notes (Signed)
 "    Established patient visit   Patient: Francisco Oneal   DOB: Nov 28, 1970   53 y.o. Male  MRN: [id number] Visit Date: 07/30/2024  Today's healthcare provider: Isaiah DELENA Pepper, MD   Chief Complaint  Patient presents with   Transitions Of Care   Subjective    HPI  Discussed the use of AI scribe software for clinical note transcription with the patient, who gave verbal consent to proceed.  History of Present Illness Francisco Oneal is a 54 year old male who presents with a persistent cough and anxiety management.  He has a persistent dry cough that occurs a few times a day, primarily in the morning and evening when at rest. The cough is non-productive. He has tried Tessalon  Perles without relief and uses Benadryl  at night to aid sleep. He has a history of using albuterol  inhalers, which he finds less effective now. His sister has similar symptoms and found relief with an albuterol  powder inhaler.  He has a history of allergies confirmed by extensive allergy testing, revealing numerous sensitivities. He experiences severe allergic reactions, including hives. His allergies have improved somewhat over time.  He has been on Prozac  for anxiety for about two to three years, initially finding it effective. Recently, he and his wife have noticed a decrease in its effectiveness. He is currently on a 40 mg dose, which was increased from 20 mg after the initial period. He reports increased anxiety, difficulty concentrating, and irritability, particularly in his work-from-home environment, which he finds isolating and stressful.  He also takes gabapentin  and methocarbamol  as needed for restless leg syndrome and muscle spasms, respectively. He uses lorazepam  occasionally for acute anxiety, especially during stressful situations like selling his house.  He monitors his blood pressure at home, which is typically around 120/80 mmHg, though it tends to be higher in clinical settings, possibly due to  'white coat syndrome'. He acknowledges recent weight gain and a sedentary lifestyle due to his job demands.   Medications: Show/hide medication list[1]  Review of Systems as noted in HPI.      Objective    BP (!) 136/96   Pulse 94   Temp 97.7 F (36.5 C) (Oral)   Wt (!) 317 lb 1.6 oz (143.8 kg)   SpO2 98%   BMI 43.01 kg/m    Physical Exam Constitutional:      Appearance: Normal appearance.  HENT:     Head: Normocephalic and atraumatic.     Mouth/Throat:     Mouth: Mucous membranes are moist.  Eyes:     Pupils: Pupils are equal, round, and reactive to light.  Cardiovascular:     Rate and Rhythm: Normal rate.  Pulmonary:     Effort: Pulmonary effort is normal. No respiratory distress.     Breath sounds: Normal breath sounds. No wheezing.  Skin:    General: Skin is warm.  Neurological:     General: No focal deficit present.     Mental Status: He is alert.      No results found for any visits on 07/30/24.  Assessment & Plan     Problem List Items Addressed This Visit       Cardiovascular and Mediastinum   White coat syndrome with hypertension     Respiratory   Reactive airway disease - Primary   Relevant Medications   Albuterol  Sulfate (PROAIR  RESPICLICK) 108 (90 Base) MCG/ACT AEPB   predniSONE  (DELTASONE ) 20 MG tablet     Other   GAD (  generalized anxiety disorder)   Relevant Medications   FLUoxetine  (PROZAC ) 20 MG capsule   Assessment & Plan Asthma with acute exacerbation Acute on chronic cough with wheezing and albuterol  response suggests possible asthma diagnosis. Differential includes asthma and post-viral cough. Has been prescribed Breo ellipta  in the past but does not use consistently.  - Prescribed prednisone  for 5 days. - Switched to albuterol  powder inhaler as needed due to patient preference - Consider referral to pulmonologist for pulmonary function testing if symptoms persist.  White Coat Syndrome with HTN Blood pressure elevated in  clinic, but well controlled at home (120/80s). Discussed patient should continue home BP monitoring. Goal BP <140/90.  Generalized anxiety disorder Chronic, uncontrolled. Prozac  40 mg daily with decreased efficacy. Increased anxiety and irritability noted. - Increased Prozac  to 60 mg daily - Continue Ativan  0.5mg  BID PRN, only for acute anxiety attacks - Scheduled follow-up in 2 months to assess response. - Consider switching to Cymbalta if not improving    Return in about 8 weeks (around 09/24/2024) for Follow Up for Mood.       Isaiah DELENA Pepper, MD  [facility] [phone] (phone) [phone] (fax)     [1]  Outpatient Medications Prior to Visit  Medication Sig   fluticasone  furoate-vilanterol (BREO ELLIPTA ) 200-25 MCG/ACT AEPB Inhale 1 puff into the lungs daily.   gabapentin  (NEURONTIN ) 300 MG capsule Take 1 capsule (300 mg total) by mouth 3 (three) times daily.   LORazepam  (ATIVAN ) 0.5 MG tablet Take 1 tablet (0.5 mg total) by mouth 2 (two) times daily as needed for anxiety.   methocarbamol  (ROBAXIN ) 500 MG tablet Take 1 tablet (500 mg total) by mouth every 8 (eight) hours as needed for muscle spasms.   pantoprazole  (PROTONIX ) 40 MG tablet Take 1 tablet (40 mg total) by mouth daily.   rosuvastatin  (CRESTOR ) 10 MG tablet Take 1 tablet (10 mg total) by mouth daily.   [DISCONTINUED] albuterol  (VENTOLIN  HFA) 108 (90 Base) MCG/ACT inhaler TAKE 2 PUFFS BY MOUTH EVERY 6 HOURS AS NEEDED FOR WHEEZE OR SHORTNESS OF BREATH   [DISCONTINUED] FLUoxetine  (PROZAC ) 40 MG capsule Take 1 capsule (40 mg total) by mouth daily.   [DISCONTINUED] Vitamin D , Ergocalciferol , (DRISDOL ) 1.25 MG (50000 UNIT) CAPS capsule Take 1 capsule (50,000 Units total) by mouth every 7 (seven) days. (Patient not taking: Reported on 07/30/2024)   No facility-administered medications prior to visit.   "

## 2024-08-13 ENCOUNTER — Other Ambulatory Visit: Payer: Self-pay

## 2024-08-13 DIAGNOSIS — F411 Generalized anxiety disorder: Secondary | ICD-10-CM

## 2024-09-24 ENCOUNTER — Ambulatory Visit
